# Patient Record
Sex: Female | Born: 1950 | Race: White | Hispanic: No | Marital: Married | Smoking: Never smoker
Health system: Southern US, Community
[De-identification: ages and names within clinical notes are randomized; demographics above are authoritative.]

## PROBLEM LIST (undated history)

## (undated) DIAGNOSIS — K219 Gastro-esophageal reflux disease without esophagitis: Secondary | ICD-10-CM

## (undated) DIAGNOSIS — E119 Type 2 diabetes mellitus without complications: Secondary | ICD-10-CM

## (undated) DIAGNOSIS — I2699 Other pulmonary embolism without acute cor pulmonale: Secondary | ICD-10-CM

---
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
## 2015-05-24 ENCOUNTER — Encounter (HOSPITAL_COMMUNITY): Payer: Self-pay | Admitting: Internal Medicine

## 2015-05-24 ENCOUNTER — Inpatient Hospital Stay (HOSPITAL_COMMUNITY)
Admission: AD | Admit: 2015-05-24 | Discharge: 2015-05-29 | DRG: 175 | Disposition: A | Discharge status: Home / Self Care | Payer: BLUE CROSS/BLUE SHIELD | Source: Other Acute Inpatient Hospital | Attending: Internal Medicine | Admitting: Internal Medicine

## 2015-05-24 DIAGNOSIS — I2692 Saddle embolus of pulmonary artery without acute cor pulmonale: Secondary | ICD-10-CM | POA: Diagnosis present

## 2015-05-24 DIAGNOSIS — I82441 Acute embolism and thrombosis of right tibial vein: Secondary | ICD-10-CM | POA: Diagnosis present

## 2015-05-24 DIAGNOSIS — I824Z1 Acute embolism and thrombosis of unspecified deep veins of right distal lower extremity: Secondary | ICD-10-CM | POA: Diagnosis not present

## 2015-05-24 DIAGNOSIS — D696 Thrombocytopenia, unspecified: Secondary | ICD-10-CM | POA: Diagnosis not present

## 2015-05-24 DIAGNOSIS — E1165 Type 2 diabetes mellitus with hyperglycemia: Secondary | ICD-10-CM | POA: Diagnosis not present

## 2015-05-24 DIAGNOSIS — I2699 Other pulmonary embolism without acute cor pulmonale: Secondary | ICD-10-CM

## 2015-05-24 DIAGNOSIS — Z880 Allergy status to penicillin: Secondary | ICD-10-CM

## 2015-05-24 DIAGNOSIS — E1310 Other specified diabetes mellitus with ketoacidosis without coma: Secondary | ICD-10-CM | POA: Diagnosis present

## 2015-05-24 DIAGNOSIS — K219 Gastro-esophageal reflux disease without esophagitis: Secondary | ICD-10-CM | POA: Diagnosis present

## 2015-05-24 DIAGNOSIS — E876 Hypokalemia: Secondary | ICD-10-CM | POA: Diagnosis present

## 2015-05-24 DIAGNOSIS — J9859 Other diseases of mediastinum, not elsewhere classified: Secondary | ICD-10-CM

## 2015-05-24 DIAGNOSIS — IMO0002 Reserved for concepts with insufficient information to code with codable children: Secondary | ICD-10-CM | POA: Insufficient documentation

## 2015-05-24 DIAGNOSIS — D649 Anemia, unspecified: Secondary | ICD-10-CM | POA: Diagnosis present

## 2015-05-24 DIAGNOSIS — Z23 Encounter for immunization: Secondary | ICD-10-CM | POA: Diagnosis not present

## 2015-05-24 DIAGNOSIS — R222 Localized swelling, mass and lump, trunk: Secondary | ICD-10-CM | POA: Diagnosis not present

## 2015-05-24 DIAGNOSIS — R911 Solitary pulmonary nodule: Secondary | ICD-10-CM | POA: Diagnosis present

## 2015-05-24 DIAGNOSIS — E1110 Type 2 diabetes mellitus with ketoacidosis without coma: Secondary | ICD-10-CM

## 2015-05-24 HISTORY — DX: Other pulmonary embolism without acute cor pulmonale: I26.99

## 2015-05-24 HISTORY — DX: Type 2 diabetes mellitus without complications: E11.9

## 2015-05-24 HISTORY — DX: Gastro-esophageal reflux disease without esophagitis: K21.9

## 2015-05-24 LAB — CBC
HEMATOCRIT: 36 % (ref 36.0–46.0)
HEMOGLOBIN: 13 g/dL (ref 12.0–15.0)
MCH: 30.7 pg (ref 26.0–34.0)
MCHC: 36.1 g/dL — ABNORMAL HIGH (ref 30.0–36.0)
MCV: 84.9 fL (ref 78.0–100.0)
Platelets: 122 10*3/uL — ABNORMAL LOW (ref 150–400)
RBC: 4.24 MIL/uL (ref 3.87–5.11)
RDW: 14.7 % (ref 11.5–15.5)
WBC: 10.7 10*3/uL — ABNORMAL HIGH (ref 4.0–10.5)

## 2015-05-24 LAB — COMPREHENSIVE METABOLIC PANEL
ALK PHOS: 91 U/L (ref 38–126)
ALT: 14 U/L (ref 14–54)
ANION GAP: 7 (ref 5–15)
AST: 19 U/L (ref 15–41)
Albumin: 2.7 g/dL — ABNORMAL LOW (ref 3.5–5.0)
BUN: 7 mg/dL (ref 6–20)
CHLORIDE: 108 mmol/L (ref 101–111)
CO2: 22 mmol/L (ref 22–32)
Calcium: 8.3 mg/dL — ABNORMAL LOW (ref 8.9–10.3)
Creatinine, Ser: 0.94 mg/dL (ref 0.44–1.00)
GFR calc Af Amer: >60 mL/min (ref >60)
GFR calc non Af Amer: >60 mL/min (ref >60)
GLUCOSE: 189 mg/dL — AB (ref 65–99)
Potassium: 2.7 mmol/L — CL (ref 3.5–5.1)
SODIUM: 137 mmol/L (ref 135–145)
TOTAL PROTEIN: 5.1 g/dL — AB (ref 6.5–8.1)
Total Bilirubin: 0.5 mg/dL (ref 0.3–1.2)

## 2015-05-24 LAB — MAGNESIUM: Magnesium: 1.8 mg/dL (ref 1.7–2.4)

## 2015-05-24 LAB — GLUCOSE, CAPILLARY: GLUCOSE-CAPILLARY: 188 mg/dL — AB (ref 65–99)

## 2015-05-24 LAB — URINALYSIS, ROUTINE W REFLEX MICROSCOPIC
Bilirubin Urine: NEGATIVE
Ketones, ur: NEGATIVE mg/dL
Leukocytes, UA: NEGATIVE
NITRITE: NEGATIVE
Protein, ur: NEGATIVE mg/dL
Specific Gravity, Urine: 1.042 — ABNORMAL HIGH (ref 1.005–1.030)
Urobilinogen, UA: 1 mg/dL (ref 0.0–1.0)
pH: 6 (ref 5.0–8.0)

## 2015-05-24 LAB — PHOSPHORUS: PHOSPHORUS: 1.2 mg/dL — AB (ref 2.5–4.6)

## 2015-05-24 LAB — PROTIME-INR
INR: 1.3 (ref 0.00–1.49)
Prothrombin Time: 16.3 seconds — ABNORMAL HIGH (ref 11.6–15.2)

## 2015-05-24 LAB — HEPARIN LEVEL (UNFRACTIONATED): Heparin Unfractionated: 1.14 IU/mL — ABNORMAL HIGH (ref 0.30–0.70)

## 2015-05-24 LAB — APTT

## 2015-05-24 LAB — URINE MICROSCOPIC-ADD ON

## 2015-05-24 LAB — MRSA PCR SCREENING: MRSA BY PCR: POSITIVE — AB

## 2015-05-24 MED ORDER — SODIUM CHLORIDE 0.9 % IV SOLN: 250.0000 mL | INTRAVENOUS | Status: DC | PRN

## 2015-05-24 MED ORDER — PANTOPRAZOLE SODIUM 40 MG PO TBEC
40.0000 mg | DELAYED_RELEASE_TABLET | Freq: Every day | ORAL | Status: DC
  Administered 2015-05-25 – 2015-05-29 (×5): 40 mg via ORAL
  Filled 2015-05-24 (×5): qty 1

## 2015-05-24 MED ORDER — INSULIN ASPART 100 UNIT/ML ~~LOC~~ SOLN
2.0000 [IU] | SUBCUTANEOUS | Status: DC
  Administered 2015-05-25 (×2): 2 [IU] via SUBCUTANEOUS

## 2015-05-24 MED ORDER — MUPIROCIN 2 % EX OINT
1.0000 "application " | TOPICAL_OINTMENT | Freq: Two times a day (BID) | CUTANEOUS | Status: AC
  Administered 2015-05-25 – 2015-05-29 (×10): 1 via NASAL
  Filled 2015-05-24 (×6): qty 22

## 2015-05-24 MED ORDER — CHLORHEXIDINE GLUCONATE CLOTH 2 % EX PADS
6.0000 | MEDICATED_PAD | Freq: Every day | CUTANEOUS | Status: DC
  Administered 2015-05-25 – 2015-05-28 (×4): 6 via TOPICAL

## 2015-05-24 MED ORDER — HEPARIN (PORCINE) IN NACL 100-0.45 UNIT/ML-% IJ SOLN
1150.0000 [IU]/h | INTRAMUSCULAR | Status: DC
Start: 2015-05-24 — End: 2015-05-25
  Administered 2015-05-25: 1150 [IU]/h via INTRAVENOUS

## 2015-05-24 MED ORDER — POTASSIUM CHLORIDE CRYS ER 20 MEQ PO TBCR
40.0000 meq | EXTENDED_RELEASE_TABLET | Freq: Once | ORAL | Status: AC
  Administered 2015-05-24: 40 meq via ORAL
  Filled 2015-05-24: qty 2

## 2015-05-24 MED ORDER — HEPARIN (PORCINE) IN NACL 100-0.45 UNIT/ML-% IJ SOLN
1360.0000 [IU]/h | INTRAMUSCULAR | Status: DC
  Administered 2015-05-24: 1350 [IU]/h via INTRAVENOUS

## 2015-05-24 NOTE — H&P (Signed)
PULMONARY / CRITICAL CARE MEDICINE HISTORY AND PHYSICAL EXAMINATION   Name: Kristen Contreras MRN: [id number] DOB: 06/23/1951    ADMISSION DATE:  05/24/2015  PRIMARY SERVICE: PCCM  CHIEF COMPLAINT:  PE  BRIEF PATIENT DESCRIPTION: 26 F with DM admitted to [facility] on 6/1 for DKA who developed vasovagal symptoms on 6/3 during BM which lead to PE protocol CT who revealed a large PE. Patient transferred to [facility] for further evaluation.  SIGNIFICANT EVENTS / STUDIES:  PE Protocol CT [facility]: Large Saddle PE with RV/LV ratio of 2.2 and 7 mm LLL and mediastinal mass, Pericholecystic Fluid  LINES / TUBES: PIVs  CULTURES: Blood cultures at [place]: None  HISTORY OF PRESENT ILLNESS:  Ms. Kristen Contreras is a 63 F with no significant previous PMH who was admitted to [facility] on 6/1 with DKA which was detected after she presented for 1 week of not feeling well. Her sugars were stabilized and she was transferred to the floor for further management. This morning, she had one of several recent episodes of presyncope which resulting in a CTA being done which detected a large PE. She was transferred for consideration of advanced therapy.   She has minimal current complaints. She feels "puffy" and believes she has gotten 20 L fluid recently. She denies chest pain, fevers, chills, cough, and abdominal pain. She notes that she has not had any preventative medical care since she had her C-section approximately 26 yrs ago. She takes a PPI for self-diagnosed GERD.   PAST MEDICAL HISTORY :  Past Medical History  Diagnosis Date  . DM (diabetes mellitus)   . Pulmonary embolism    Past Surgical History  Procedure Laterality Date  . Cesarean section     Prior to Admission medications   Not on File   Allergies  Allergen Reactions  . Penicillins     FAMILY HISTORY:  Family History  Problem Relation Age of Onset  . Heart attack Father 49  . Heart attack Mother 80   SOCIAL HISTORY:  reports that she  has never smoked. She does not have any smokeless tobacco history on file. She reports that she does not drink alcohol. Her drug history is not on file.  REVIEW OF SYSTEMS:  A 12-system ROS was conducted and, unless otherwise specified in the HPI, was negative.   SUBJECTIVE:   VITAL SIGNS: Temp:  [97.9 F (36.6 C)] 97.9 F (36.6 C) (06/04 1952) Pulse Rate:  [114] 114 (06/04 1908) Resp:  [19] 19 (06/04 1908) BP: (113)/(78) 113/78 mmHg (06/04 1908) SpO2:  [99 %] 99 % (06/04 1908) HEMODYNAMICS:   VENTILATOR SETTINGS:   INTAKE / OUTPUT: Intake/Output    None     PHYSICAL EXAMINATION: General:  NAD Neuro:  Intact HEENT:  Sclera anicteric, conjunctiva pink, MMM, OP Cleaer Neck: Trachea supple and midline, (-) LAN or JVD Cardiovascular:  RRR, nS1/S2, (-) MRG Lungs:  CTAB Abdomen:  S/NT/ND/(+)BS, no Murhpy's sign Musculoskeletal:  (-) C/C/E, compression stocking present Skin:  Intact  LABS:  Reviewed [facility] Paper Chart, Relevant Labs from today Summarized Below: UA with (+) Glucose PLT 136 K 3.3 Mg 1.6 Bicarb 12 AG 20 Trop 0.01 ALK 118 BNP 118 ABG 7.35/20/62  CBC No results for input(s): WBC, HGB, HCT, PLT in the last 168 hours. Coag's No results for input(s): APTT, INR in the last 168 hours. BMET No results for input(s): NA, K, CL, CO2, BUN, CREATININE, GLUCOSE in the last 168 hours. Electrolytes No results for input(s):  CALCIUM, MG, PHOS in the last 168 hours. Sepsis Markers No results for input(s): LATICACIDVEN, PROCALCITON, O2SATVEN in the last 168 hours. ABG No results for input(s): PHART, PCO2ART, PO2ART in the last 168 hours. Liver Enzymes No results for input(s): AST, ALT, ALKPHOS, BILITOT, ALBUMIN in the last 168 hours. Cardiac Enzymes No results for input(s): TROPONINI, PROBNP in the last 168 hours. Glucose No results for input(s): GLUCAP in the last 168 hours.  Imaging No results found.  EKG: None included in transfer packet CT PE:  Personally reviewed, results summarized above (agree with OSH read)  ASSESSMENT / PLAN:  Active Problems:   * No active hospital problems. *   PULMONARY A: Pulmonary nodule and mediastinal mass: Can address once plan for PE Rx established. ? Residual thymus tissue for mediastinal mass. Pulmonary nodule low risk given absent smoking histroy. P:   Address mediastinal mass when less acute Repeat CT scan in 6-12 months and then if no change in 18-24 months  CARDIOVASCULAR A: No acute issue:   RENAL A: Hypokalemia:  Hyopmagensiumemia: P:   Replete pending repeated labs here  GASTROINTESTINAL A: Pericholecystic Fluid with elavated ALK phos: No evidence of cholecystitis on exam.  P:   Monitor  HEMATOLOGIC A: Acute PE with evidence of right heart strain: No emergent indication for intervention as HD stable. Presence of mediastinal mass concerning given VTE.   P:   VIR evaluation in AM  Heparin GTT, can potentially start oral anticoags once decision RE EKOS made in AM LE dopplers Check Echo   INFECTIOUS A: No acute issues:  P:     ENDOCRINE A: DKA with possible reopening of GAP: Based on labs from [facility] gap has likely reopened. No need for aggressive fluids again if gap open but will need insulin drip. P:   Follow-up labs (discussed with Dr. Stevenson Clinch in [place]), DM pending results NPO for now given possible need for drip  NEUROLOGIC A: No acute issues:  P:     BEST PRACTICE / DISPOSITION Level of Care:  ICU Primary Service:  PCCM Consultants:  VIR Code Status:  Full Diet:  NPO for now DVT Px:  On Full Dose AC GI Px:  On home PPI Skin Integrity:  Intact Social / Family:  Patient able to update  TODAY'S SUMMARY:   I have personally obtained a history, examined the patient, evaluated laboratory and imaging results, formulated the assessment and plan and placed orders.  CRITICAL CARE: The patient is critically ill with multiple organ systems failure and requires  high complexity decision making for assessment and support, frequent evaluation and titration of therapies, application of advanced monitoring technologies and extensive interpretation of multiple databases. Critical Care Time devoted to patient care services described in this note is 35 minutes.   Margarette Asal, MD Pulmonary and [place] Pager: [phone]   05/24/2015, 8:03 PM

## 2015-05-24 NOTE — Progress Notes (Signed)
CRITICAL VALUE ALERT  Critical value received:  APTT >200  Date of notification:  05/24/15  Time of notification:  2227  Critical value read back: Yes  Nurse who received alert:  Enzo BiBryce Sheppard RN MD notified (1st page):  MD Mungal   Time of first page:  2235  MD notified (2nd page):  Time of second page:  Responding MD:  MD Dema SeverinMungal   Time MD responded:  2240

## 2015-05-24 NOTE — Progress Notes (Signed)
CRITICAL VALUE ALERT  Critical value received:  Potassium 2.7  Date of notification: 05/24/15  Time of notification: 2202   Critical value read back: Yes  Nurse who received alert: Hazel Samshristian Contina Strain RN   MD notified (1st page): MD Mungal   Time of first page: 2205  MD notified (2nd page):  Time of second page:  Responding MD: MD Dema SeverinMungal   Time MD responded:  2205

## 2015-05-24 NOTE — Progress Notes (Signed)
Stopped Heparin IV drip and notified Christoper FabianCaron Amend [facility] in [facility] to patient's Heparin level and aPTT being elevated. Christoper FabianCaron Amend [facility] ordered to hold heparin and will restart in an hour with new dosing order. Pt is not showing any signs of bleeding at this time. Pt is alert and oriented. Will continue to monitor and assess.

## 2015-05-24 NOTE — Progress Notes (Signed)
ANTICOAGULATION CONSULT NOTE - Follow-up Consult  Pharmacy Consult for heparin Indication: pulmonary embolism  Allergies  Allergen Reactions  . Penicillins     Patient Measurements:   Heparin Dosing Weight: 72.7 kg  Vital Signs: Temp: 97.9 F (36.6 C) (06/04 1952) Temp Source: Oral (06/04 1952) BP: 113/80 mmHg (06/04 2200) Pulse Rate: 102 (06/04 2200)  Labs:  Recent Labs  05/24/15 2030  HGB 13.0  HCT 36.0  PLT 122*  APTT >200*  LABPROT 16.3*  INR 1.30  HEPARINUNFRC 1.14*  CREATININE 0.94    CrCl cannot be calculated (Unknown ideal weight.).  Assessment: 64 yo female on heparin for pulmonary embolism. Heparin level remains elevated at 1.14 on 1360 units/hr. [facility]. CBC stable, plt 122. No bleeding noted.    Goal of Therapy:  Heparin level 0.3-0.7 units/ml Monitor platelets by anticoagulation protocol: Yes   Plan:  - Hold heparin x 1 hour - Restart heparin at 1150 units/hr - F/u 6 hr heparin level - Daily heparin level and CBC  Christoper Fabianaron Rilya Longo, PharmD, BCPS Clinical pharmacist, pager [phone] 05/24/2015,10:37 PM

## 2015-05-24 NOTE — Progress Notes (Signed)
ANTICOAGULATION CONSULT NOTE - Initial Consult  Pharmacy Consult for heparin Indication: pulmonary embolism  Allergies  Allergen Reactions  . Penicillins     Patient Measurements:   Heparin Dosing Weight: 72.7 kg  Vital Signs: Temp: 97.9 F (36.6 C) (06/04 1952) Temp Source: Oral (06/04 1952) BP: 113/78 mmHg (06/04 1908) Pulse Rate: 114 (06/04 1908)  Labs: No results for input(s): HGB, HCT, PLT, APTT, LABPROT, INR, HEPARINUNFRC, CREATININE, CKTOTAL, CKMB, TROPONINI in the last 72 hours.  CrCl cannot be calculated (Unknown ideal weight.).   Medical History: Past Medical History  Diagnosis Date  . DM (diabetes mellitus)   . Pulmonary embolism   . GERD (gastroesophageal reflux disease)     Medications:  Scheduled:  . pantoprazole  40 mg Oral Daily   Infusions:    Assessment: 64 yo female [facility]  [facility]  Per RN, heparin level @ [facility] this morning was 1.03 and drip rate was adjusted to 1360 units/hr, which was the rate the patient is currently on.  Hgb 13 and Plt 136 K @ [facility] this morning.    Patient weighs 82.9 kg and is 5 feet 4 inches.  Goal of Therapy:  Heparin level 0.3-0.7 units/ml Monitor platelets by anticoagulation protocol: Yes   Plan:  - continue heparin @ 1360 units/hr for now (which is the [facility]) - stat heparin level to reassess dosing - daily heparin level and CBC  Antolin Belsito, Tsz-Yin 05/24/2015,8:10 PM

## 2015-05-25 ENCOUNTER — Inpatient Hospital Stay (HOSPITAL_COMMUNITY): Payer: BLUE CROSS/BLUE SHIELD

## 2015-05-25 DIAGNOSIS — R222 Localized swelling, mass and lump, trunk: Secondary | ICD-10-CM

## 2015-05-25 DIAGNOSIS — E1310 Other specified diabetes mellitus with ketoacidosis without coma: Secondary | ICD-10-CM

## 2015-05-25 DIAGNOSIS — I2699 Other pulmonary embolism without acute cor pulmonale: Secondary | ICD-10-CM

## 2015-05-25 DIAGNOSIS — J9859 Other diseases of mediastinum, not elsewhere classified: Secondary | ICD-10-CM

## 2015-05-25 DIAGNOSIS — E1110 Type 2 diabetes mellitus with ketoacidosis without coma: Secondary | ICD-10-CM

## 2015-05-25 LAB — BASIC METABOLIC PANEL
ANION GAP: 8 (ref 5–15)
Anion gap: 9 (ref 5–15)
BUN: 7 mg/dL (ref 6–20)
BUN: 7 mg/dL (ref 6–20)
CALCIUM: 8.1 mg/dL — AB (ref 8.9–10.3)
CHLORIDE: 111 mmol/L (ref 101–111)
CHLORIDE: 110 mmol/L (ref 101–111)
CO2: 20 mmol/L — ABNORMAL LOW (ref 22–32)
CO2: 22 mmol/L (ref 22–32)
CREATININE: 0.76 mg/dL (ref 0.44–1.00)
CREATININE: 0.79 mg/dL (ref 0.44–1.00)
Calcium: 8.6 mg/dL — ABNORMAL LOW (ref 8.9–10.3)
GFR calc Af Amer: >60 mL/min (ref >60)
GFR calc Af Amer: >60 mL/min (ref >60)
GFR calc non Af Amer: >60 mL/min (ref >60)
GFR calc non Af Amer: >60 mL/min (ref >60)
GLUCOSE: 214 mg/dL — AB (ref 65–99)
Glucose, Bld: 100 mg/dL — ABNORMAL HIGH (ref 65–99)
POTASSIUM: 2.9 mmol/L — AB (ref 3.5–5.1)
POTASSIUM: 4.6 mmol/L (ref 3.5–5.1)
SODIUM: 140 mmol/L (ref 135–145)
SODIUM: 140 mmol/L (ref 135–145)

## 2015-05-25 LAB — CBC
HEMATOCRIT: 33.4 % — AB (ref 36.0–46.0)
HEMOGLOBIN: 12.2 g/dL (ref 12.0–15.0)
MCH: 31 pg (ref 26.0–34.0)
MCHC: 36.5 g/dL — ABNORMAL HIGH (ref 30.0–36.0)
MCV: 85 fL (ref 78.0–100.0)
Platelets: 102 10*3/uL — ABNORMAL LOW (ref 150–400)
RBC: 3.93 MIL/uL (ref 3.87–5.11)
RDW: 14.6 % (ref 11.5–15.5)
WBC: 9.6 10*3/uL (ref 4.0–10.5)

## 2015-05-25 LAB — TROPONIN I: Troponin I: 0.07 ng/mL — ABNORMAL HIGH (ref <0.031)

## 2015-05-25 LAB — HEPARIN LEVEL (UNFRACTIONATED)
Heparin Unfractionated: 1.05 IU/mL — ABNORMAL HIGH (ref 0.30–0.70)
Heparin Unfractionated: 0.76 IU/mL — ABNORMAL HIGH (ref 0.30–0.70)

## 2015-05-25 LAB — GLUCOSE, CAPILLARY
GLUCOSE-CAPILLARY: 93 mg/dL (ref 65–99)
GLUCOSE-CAPILLARY: 189 mg/dL — AB (ref 65–99)
Glucose-Capillary: 131 mg/dL — ABNORMAL HIGH (ref 65–99)
Glucose-Capillary: 147 mg/dL — ABNORMAL HIGH (ref 65–99)
Glucose-Capillary: 190 mg/dL — ABNORMAL HIGH (ref 65–99)
Glucose-Capillary: 230 mg/dL — ABNORMAL HIGH (ref 65–99)
Glucose-Capillary: 243 mg/dL — ABNORMAL HIGH (ref 65–99)

## 2015-05-25 LAB — PHOSPHORUS: Phosphorus: 2.6 mg/dL (ref 2.5–4.6)

## 2015-05-25 LAB — LACTIC ACID, PLASMA
LACTIC ACID, VENOUS: 3.7 mmol/L — AB (ref 0.5–2.0)
Lactic Acid, Venous: 2.9 mmol/L (ref 0.5–2.0)

## 2015-05-25 LAB — PROCALCITONIN

## 2015-05-25 LAB — MAGNESIUM: Magnesium: 2.3 mg/dL (ref 1.7–2.4)

## 2015-05-25 MED ORDER — SODIUM CHLORIDE 0.9 % IV BOLUS (SEPSIS)
1500.0000 mL | Freq: Once | INTRAVENOUS | Status: AC
  Administered 2015-05-25: 1500 mL via INTRAVENOUS

## 2015-05-25 MED ORDER — PNEUMOCOCCAL VAC POLYVALENT 25 MCG/0.5ML IJ INJ
0.5000 mL | INJECTION | INTRAMUSCULAR | Status: AC
  Administered 2015-05-27: 0.5 mL via INTRAMUSCULAR
  Filled 2015-05-25: qty 0.5

## 2015-05-25 MED ORDER — HEPARIN (PORCINE) IN NACL 100-0.45 UNIT/ML-% IJ SOLN
1250.0000 [IU]/h | INTRAMUSCULAR | Status: AC
  Administered 2015-05-25: 850 [IU]/h via INTRAVENOUS
  Administered 2015-05-25: 950 [IU]/h via INTRAVENOUS
  Administered 2015-05-26: 900 [IU]/h via INTRAVENOUS
  Administered 2015-05-28: 1000 [IU]/h via INTRAVENOUS
  Filled 2015-05-25 (×5): qty 250

## 2015-05-25 MED ORDER — DEXTROSE-NACL 5-0.45 % IV SOLN
INTRAVENOUS | Status: DC
  Administered 2015-05-25: 12:00:00 via INTRAVENOUS

## 2015-05-25 MED ORDER — INSULIN GLARGINE 100 UNIT/ML ~~LOC~~ SOLN
5.0000 [IU] | Freq: Every day | SUBCUTANEOUS | Status: DC
  Administered 2015-05-25 – 2015-05-28 (×4): 5 [IU] via SUBCUTANEOUS
  Filled 2015-05-25 (×4): qty 0.05

## 2015-05-25 MED ORDER — INSULIN ASPART 100 UNIT/ML ~~LOC~~ SOLN
0.0000 [IU] | SUBCUTANEOUS | Status: DC
  Administered 2015-05-25: 5 [IU] via SUBCUTANEOUS
  Administered 2015-05-25 – 2015-05-26 (×4): 3 [IU] via SUBCUTANEOUS
  Administered 2015-05-26: 2 [IU] via SUBCUTANEOUS

## 2015-05-25 MED ORDER — POTASSIUM CHLORIDE 20 MEQ PO PACK
40.0000 meq | PACK | ORAL | Status: DC
Start: 2015-05-25 — End: 2015-05-25

## 2015-05-25 MED ORDER — POTASSIUM PHOSPHATES 15 MMOLE/5ML IV SOLN
24.0000 mmol | Freq: Once | INTRAVENOUS | Status: AC
  Administered 2015-05-25: 24 mmol via INTRAVENOUS
  Filled 2015-05-25: qty 8

## 2015-05-25 MED ORDER — POTASSIUM CHLORIDE CRYS ER 20 MEQ PO TBCR
40.0000 meq | EXTENDED_RELEASE_TABLET | ORAL | Status: AC
  Administered 2015-05-25 (×2): 40 meq via ORAL
  Filled 2015-05-25 (×2): qty 2

## 2015-05-25 MED ORDER — MAGNESIUM SULFATE 2 GM/50ML IV SOLN
2.0000 g | Freq: Once | INTRAVENOUS | Status: AC
  Administered 2015-05-25: 2 g via INTRAVENOUS
  Filled 2015-05-25: qty 50

## 2015-05-25 NOTE — Progress Notes (Addendum)
LAb review pm below  PULMONARY No results for input(s): PHART, PCO2ART, PO2ART, HCO3, TCO2, O2SAT in the last 168 hours.  Invalid input(s): PCO2, PO2  CBC  Recent Labs Lab 05/24/15 2030 05/25/15 0541  HGB 13.0 12.2  HCT 36.0 33.4*  WBC 10.7* 9.6  PLT 122* 102*    COAGULATION  Recent Labs Lab 05/24/15 2030  INR 1.30    CARDIAC   Recent Labs Lab 05/25/15 1315  TROPONINI 0.07*   No results for input(s): PROBNP in the last 168 hours.   CHEMISTRY  Recent Labs Lab 05/24/15 2030 05/25/15 0541 05/25/15 1315  NA 137 140 140  K 2.7* 2.9* 4.6  CL 108 111 110  CO2 22 20* 22  GLUCOSE 189* 100* 214*  BUN 7 7 7   CREATININE 0.94 0.76 0.79  CALCIUM 8.3* 8.1* 8.6*  MG 1.8  --  2.3  PHOS 1.2*  --  2.6   CrCl cannot be calculated (Unknown ideal weight.).   LIVER  Recent Labs Lab 05/24/15 2030  AST 19  ALT 14  ALKPHOS 91  BILITOT 0.5  PROT 5.1*  ALBUMIN 2.7*  INR 1.30     INFECTIOUS  Recent Labs Lab 05/25/15 1315  LATICACIDVEN 3.7*  PROCALCITON <0.10     ENDOCRINE CBG (last 3)   Recent Labs  05/25/15 0400 05/25/15 0819 05/25/15 1231  GLUCAP 131* 93 189*         IMAGING x48h  - image(s) personally visualized  -   highlighted in bold Dg Chest Port 1 View  05/25/2015   CLINICAL DATA:  Pulmonary embolism.  EXAM: PORTABLE CHEST - 1 VIEW  COMPARISON:  None.  FINDINGS: Normal mediastinum and cardiac silhouette. Normal pulmonary vasculature. No evidence of effusion, infiltrate, or pneumothorax. No acute bony abnormality.  IMPRESSION: No acute cardiopulmonary process.   Electronically Signed   By: Genevive BiStewart  Edmunds M.D.   On: 05/25/2015 15:12     A/P  ECHO - RV STRAIN - SEVERE - call from Dr Anne FuSkains 05/25/2015 5:01 PM Hhigh lactic acidosis - >? Due to PE likely- ? Actually better than yesterday; we dont know. PCT normal - argues against sepsis   - plan: 1.5L saline bolus and recheck lactate at 7pm,   - If worse, definitely needs lytics as  rescue    DKA - bic improved to 22, Aniong gap 8 with alb 2.7 - corrected for normal alb - still normal  - continue D5 half normal at 50cc/h  - continue lantus 5 daily + ssi  Hypomag/Hypophos -resolved  Tx  - ok for sdu -trh pick up 05/26/15  But CCM will stay on board due to lactic acidosis  + RV strain  Dr. Kalman ShanMurali Special Ranes, M.D., [facility].C.P Pulmonary and Critical Care Medicine Staff Physician [facility]monary and Critical Care Pager: [phone], If no answer or between  15:00h - 7:00h: call [phone]  05/25/2015 3:19 PM

## 2015-05-25 NOTE — Progress Notes (Addendum)
PULMONARY / CRITICAL CARE MEDICINE HISTORY AND PHYSICAL EXAMINATION   Name: Kristen Contreras MRN: [id number] DOB: 09/21/1951    ADMISSION DATE:  05/24/2015  PRIMARY SERVICE: PCCM  CHIEF COMPLAINT:  PE  BRIEF PATIENT DESCRIPTION:  [name] is a 66 F with no significant previous PMH who was admitted to [facility] on 6/1 with DKA which was detected after she presented for 1 week of not feeling well. Her sugars were stabilized and she was transferred to the floor for further management. This morning, she had one of several recent episodes of presyncope which resulting in a CTA being done which detected a large PE. She was transferred for consideration of advanced therapy.   She has minimal current complaints. She feels "puffy" and believes she has gotten 20 L fluid recently. She denies chest pain, fevers, chills, cough, and abdominal pain. She notes that she has not had any preventative medical care since she had her C-section approximately 26 yrs ago. She takes a PPI for self-diagnosed GERD.     reports that she has never smoked. She does not have any smokeless tobacco history on file.   has a past medical history of DM (diabetes mellitus); Pulmonary embolism; and GERD (gastroesophageal reflux disease).   LINES / TUBES: PIVs  CULTURES: Blood cultures at [facility]  ANTIBIOTICS: None    SIGNIFICANT EVENTS / STUDIES:  PE Protocol CT [facility]: Large Saddle PE with RV/LV ratio of 2.2 and 7 mm LLL and mediastinal mass, Pericholecystic Fluid    SUBJECTIVE:  05/25/2015: Sittting in bed and texting on phone on RA and talking to husband at bedside. At time of transfer with O2  - Class2 Low PESI score. Since then weaned off o2 - now PESI Class1.  Per RN: no overnight issues. On IV heparin  - pharmacy says she is supra=therapeutic. Awatiing ECHO and duplex LE  Per RN: she is not in DKA now (Was prior to transfer). She is on subcut insulin and ssi ion phase 1 hypergluycemia. This is new onset DKA.  But bic this am dropped to 20 (was 24). Currntly  Not on fluids    VITAL SIGNS: Temp:  [97.8 F (36.6 C)-98.3 F (36.8 C)] 97.8 F (36.6 C) (06/05 0817) Pulse Rate:  [86-114] 96 (06/05 0800) Resp:  [14-23] 20 (06/05 0800) BP: (92-122)/(62-82) 122/70 mmHg (06/05 0800) SpO2:  [91 %-99 %] 95 % (06/05 0800) Weight:  [83 kg (182 lb 15.7 oz)-83.4 kg (183 lb 13.8 oz)] 83.4 kg (183 lb 13.8 oz) (06/05 0500) HEMODYNAMICS:   VENTILATOR SETTINGS:   INTAKE / OUTPUT: Intake/Output      06/04 0701 - 06/05 0700 06/05 0701 - 06/06 0700   P.O. 240    I.V. (mL/kg) 93.5 (1.1) 2.5 (0)   Total Intake(mL/kg) 333.5 (4) 2.5 (0)   Urine (mL/kg/hr) 500    Total Output 500     Net -166.6 +2.5          PHYSICAL EXAMINATION: General:  NAD. Looks well Neuro:  Intact HEENT:  Sclera anicteric, conjunctiva pink, MMM, OP Cleaer Neck: Trachea supple and midline, (-) LAN or JVD Cardiovascular:  RRR, nS1/S2, (-) MRG Lungs:  CTAB Abdomen:  S/NT/ND/(+)BS, no Murhpy's sign Musculoskeletal:  (-) C/C/E, compression stocking present Skin:  Intact  LABS:   [facility] Paper Chart, Relevant Labs from today Summarized Below: UA with (+) Glucose PLT 136 K 3.3 Mg 1.6 Bicarb 12 AG 20 Trop 0.01 ALK 118 BNP 118 ABG 7.35/20/62  PULMONARY No results for  input(s): PHART, PCO2ART, PO2ART, HCO3, TCO2, O2SAT in the last 168 hours.  Invalid input(s): PCO2, PO2  CBC  Recent Labs Lab 05/24/15 2030 05/25/15 0541  HGB 13.0 12.2  HCT 36.0 33.4*  WBC 10.7* 9.6  PLT 122* 102*    COAGULATION  Recent Labs Lab 05/24/15 2030  INR 1.30    CARDIAC  No results for input(s): TROPONINI in the last 168 hours. No results for input(s): PROBNP in the last 168 hours.   CHEMISTRY  Recent Labs Lab 05/24/15 2030 05/25/15 0541  NA 137 140  K 2.7* 2.9*  CL 108 111  CO2 22 20*  GLUCOSE 189* 100*  BUN 7 7  CREATININE 0.94 0.76  CALCIUM 8.3* 8.1*  MG 1.8  --   PHOS 1.2*  --    CrCl cannot be  calculated (Unknown ideal weight.).   LIVER  Recent Labs Lab 05/24/15 2030  AST 19  ALT 14  ALKPHOS 91  BILITOT 0.5  PROT 5.1*  ALBUMIN 2.7*  INR 1.30     INFECTIOUS No results for input(s): LATICACIDVEN, PROCALCITON in the last 168 hours.   ENDOCRINE CBG (last 3)   Recent Labs  05/24/15 2355 05/25/15 0400 05/25/15 0819  GLUCAP 147* 131* 93         IMAGING x48h  No results found.  EKG: None included in transfer packet   ASSESSMENT / PLAN:  Active Problems:   Pulmonary embolism   PULMONARY A: Pulmonary nodule and mediastinal mass: Can address once plan for PE Rx established. ? Residual thymus tissue for mediastinal mass. Pulmonary nodule low risk for malignancy given absent smoking histroy. P:   Address mediastinal mass when less acute Repeat CT scan in 6-12 months and then if no change in 18-24 months   CARDIOVASCULAR A: No acute issue:   RENAL A: Hypokalemia -severe Hypomagnesemia - mild < 2gm% Hypophosphatemia - severe  P:   Replete all 3 Dc foley   GASTROINTESTINAL A: Pericholecystic Fluid with elavated ALK phos: No evidence of cholecystitis on exam.   - LFT normal at admit P:   Monitor Diabeteic diet  HEMATOLOGIC A: Acute PE with evidence of right heart strain: No emergent indication for intervention as HD stable.    - Severity - PESI class 2 Low risk at admit, Currently improved to Class 1  - Idiopathich but mediastinal mass needs workup  05/25/15 : improved clinically . On IV heparin   P:   No need for EKOS -reserve as rescue Rx given PESI-2 Heparin GTT would do 3-5 days of IV heparin and then start oral agent  Need to discuss oral anticoagulation strategy 05/26/15 LE dopplers Check Echo  INFECTIOUS A: No acute issues:  P:   Check sepsis biomarketrs to be on safe side  ENDOCRINE A: DKA with possible reopening of GAP:    - BIC down to 20 this am 05/25/15 and worse  PLAN - repeat bmet  - start d5 half normal  50cc/h - start daily low dose lantus - ssi - DM consult - DM diet   NEUROLOGIC A: No acute issues:  P:     BEST PRACTICE / DISPOSITION Level of Care:  ICU -.move to floor/tele based on bmet results from noon 05/25/15 Primary Service:  PCCM -> will need to give to triad Consultants:  VIR Code Status:  Full Diet:  DM diet DVT Px:  On Full Dose AC GI Px:  On home PPI Skin Integrity:  Intact Social / Family:  Patient able to update. Husband updated     Dr. Brand Males, M.D., [facility].C.P Pulmonary and Critical Care Medicine Staff Physician [place] [facility]monary and Critical Care Pager: [phone], If no answer or between  15:00h - 7:00h: call [phone]  05/25/2015 11:41 AM

## 2015-05-25 NOTE — Progress Notes (Signed)
Lactic Acid of 2.9 called to AvnetPaul RN to black box.  Victorino DikeLeslie Farra Nikolic RN

## 2015-05-25 NOTE — Progress Notes (Signed)
eLink Physician-Brief Progress Note Patient Name: Kristen KailDebra Contreras DOB: 09/27/1951 MRN: [id number]   Date of Service  05/25/2015  HPI/Events of Note    eICU Interventions  Potassium given     Intervention Category Intermediate Interventions: Electrolyte abnormality - evaluation and management  [name] S. 05/25/2015, 6:49 AM

## 2015-05-25 NOTE — Progress Notes (Signed)
VASCULAR LAB PRELIMINARY  PRELIMINARY  PRELIMINARY  PRELIMINARY  Bilateral lower extremity venous Dopplers completed.    Preliminary report:  There is acute DVT noted in the right posterior tibial vein.  All other veins appear thrombus free.  Kaisy Severino, RVT 05/25/2015, 1:08 PM

## 2015-05-25 NOTE — Progress Notes (Signed)
CRITICAL VALUE ALERT  Critical value received:  Lactic acid: 3.7                                        Troponin 0.07   Nurse who received alert:  Aurelio JewMelinda M., RN  MD notified.

## 2015-05-25 NOTE — Progress Notes (Signed)
ANTICOAGULATION CONSULT NOTE - Follow Up Consult  Pharmacy Consult for heparin Indication: pulmonary embolus  Allergies  Allergen Reactions  . Penicillins Itching and Rash    Patient Measurements: Weight: 183 lb 13.8 oz (83.4 kg) Heparin Dosing Weight: 72.7 kg  Vital Signs: Temp: 97.9 F (36.6 C) (06/05 1235) Temp Source: Oral (06/05 1235) BP: 103/58 mmHg (06/05 1500) Pulse Rate: 105 (06/05 1500)  Labs:  Recent Labs  05/24/15 2030 05/25/15 0541 05/25/15 1315 05/25/15 1612  HGB 13.0 12.2  --   --   HCT 36.0 33.4*  --   --   PLT 122* 102*  --   --   APTT >200*  --   --   --   LABPROT 16.3*  --   --   --   INR 1.30  --   --   --   HEPARINUNFRC 1.14* 1.05*  --  0.76*  CREATININE 0.94 0.76 0.79  --   TROPONINI  --   --  0.07*  --     CrCl cannot be calculated (Unknown ideal weight.).   Medications:  Scheduled:  . Chlorhexidine Gluconate Cloth  6 each Topical Q0600  . insulin aspart  0-15 Units Subcutaneous 6 times per day  . insulin glargine  5 Units Subcutaneous Daily  . mupirocin ointment  1 application Nasal BID  . pantoprazole  40 mg Oral Daily  . [START ON 05/26/2015] pneumococcal 23 valent vaccine  0.5 mL Intramuscular Tomorrow-1000  . potassium phosphate IVPB (mmol)  24 mmol Intravenous Once  . sodium chloride  1,500 mL Intravenous Once   Infusions:  . dextrose 5 % and 0.45% NaCl 50 mL/hr at 05/25/15 1219  . heparin 950 Units/hr (05/25/15 0900)    Assessment: 64 yo female with PE is currently on supratherapeutic heparin.  Heparin level is 0.76.   Goal of Therapy:  Heparin level 0.3-0.7 Monitor platelets by anticoagulation protocol: Yes   Plan:  - reduce heparin to 850 units/hr - 6 hr heparin level  Zera Markwardt, Tsz-Yin 05/25/2015,5:03 PM

## 2015-05-25 NOTE — Progress Notes (Signed)
  Echocardiogram 2D Echocardiogram has been performed.  Kristen RochENNINGTON, Kristen Contreras 05/25/2015, 4:14 PM

## 2015-05-25 NOTE — Progress Notes (Signed)
Pt transported to 3W via bed by Leanna BattlesLeslie S. RN and Madelin RearMichele T. RN   Victorino DikeLeslie Gaynel Schaafsma RN

## 2015-05-25 NOTE — Progress Notes (Signed)
ANTICOAGULATION CONSULT NOTE - Follow-up Consult  Pharmacy Consult for heparin Indication: pulmonary embolism  Allergies  Allergen Reactions  . Penicillins     Patient Measurements: Weight: 183 lb 13.8 oz (83.4 kg) Heparin Dosing Weight: 72.7 kg  Vital Signs: Temp: 97.9 F (36.6 C) (06/05 0401) Temp Source: Oral (06/05 0401) BP: 116/69 mmHg (06/05 0700) Pulse Rate: 87 (06/05 0700)  Labs:  Recent Labs  05/24/15 2030 05/25/15 0541  HGB 13.0 12.2  HCT 36.0 33.4*  PLT 122* 102*  APTT >200*  --   LABPROT 16.3*  --   INR 1.30  --   HEPARINUNFRC 1.14* 1.05*  CREATININE 0.94 0.76    CrCl cannot be calculated (Unknown ideal weight.).  Assessment: 64 yo female on heparin for pulmonary embolism. Heparin level remains elevated at 1.05 on 1150 units/hr. Heparin level elevated x past 3 checks [facility] Hgb down a bit to 12.2, plt down to 102. No bleeding noted.    Goal of Therapy:  Heparin level 0.3-0.7 units/ml Monitor platelets by anticoagulation protocol: Yes   Plan:  - Hold heparin x 90 min - Restart heparin at 950 units/hr - F/u 6 hr heparin level post restart - Daily heparin level and CBC  Christoper Fabianaron Trystan Akhtar, PharmD, BCPS Clinical pharmacist, pager [phone] 05/25/2015,7:17 AM

## 2015-05-26 DIAGNOSIS — D649 Anemia, unspecified: Secondary | ICD-10-CM | POA: Insufficient documentation

## 2015-05-26 DIAGNOSIS — I2699 Other pulmonary embolism without acute cor pulmonale: Secondary | ICD-10-CM

## 2015-05-26 DIAGNOSIS — D696 Thrombocytopenia, unspecified: Secondary | ICD-10-CM | POA: Insufficient documentation

## 2015-05-26 DIAGNOSIS — IMO0002 Reserved for concepts with insufficient information to code with codable children: Secondary | ICD-10-CM | POA: Insufficient documentation

## 2015-05-26 DIAGNOSIS — E1165 Type 2 diabetes mellitus with hyperglycemia: Secondary | ICD-10-CM | POA: Insufficient documentation

## 2015-05-26 LAB — GLUCOSE, CAPILLARY
GLUCOSE-CAPILLARY: 122 mg/dL — AB (ref 65–99)
GLUCOSE-CAPILLARY: 165 mg/dL — AB (ref 65–99)
GLUCOSE-CAPILLARY: 166 mg/dL — AB (ref 65–99)
Glucose-Capillary: 95 mg/dL (ref 65–99)
Glucose-Capillary: 186 mg/dL — ABNORMAL HIGH (ref 65–99)
Glucose-Capillary: 96 mg/dL (ref 65–99)

## 2015-05-26 LAB — PROCALCITONIN: Procalcitonin: <0.1 ng/mL

## 2015-05-26 LAB — CBC WITH DIFFERENTIAL/PLATELET
BASOS ABS: 0 10*3/uL (ref 0.0–0.1)
BASOS PCT: 0 % (ref 0–1)
EOS ABS: 0.1 10*3/uL (ref 0.0–0.7)
Eosinophils Relative: 2 % (ref 0–5)
HCT: 32.2 % — ABNORMAL LOW (ref 36.0–46.0)
Hemoglobin: 11.4 g/dL — ABNORMAL LOW (ref 12.0–15.0)
LYMPHS PCT: 38 % (ref 12–46)
Lymphs Abs: 2.8 10*3/uL (ref 0.7–4.0)
MCH: 31.5 pg (ref 26.0–34.0)
MCHC: 35.4 g/dL (ref 30.0–36.0)
MCV: 89 fL (ref 78.0–100.0)
MONOS PCT: 7 % (ref 3–12)
Monocytes Absolute: 0.5 10*3/uL (ref 0.1–1.0)
NEUTROS ABS: 3.8 10*3/uL (ref 1.7–7.7)
NEUTROS PCT: 52 % (ref 43–77)
PLATELETS: 113 10*3/uL — AB (ref 150–400)
RBC: 3.62 MIL/uL — ABNORMAL LOW (ref 3.87–5.11)
RDW: 14.9 % (ref 11.5–15.5)
WBC: 7.2 10*3/uL (ref 4.0–10.5)

## 2015-05-26 LAB — BASIC METABOLIC PANEL
Anion gap: 5 (ref 5–15)
BUN: <5 mg/dL — ABNORMAL LOW (ref 6–20)
CHLORIDE: 113 mmol/L — AB (ref 101–111)
CO2: 24 mmol/L (ref 22–32)
Calcium: 8.1 mg/dL — ABNORMAL LOW (ref 8.9–10.3)
Creatinine, Ser: 0.81 mg/dL (ref 0.44–1.00)
GFR calc non Af Amer: >60 mL/min (ref >60)
GLUCOSE: 124 mg/dL — AB (ref 65–99)
Potassium: 3.5 mmol/L (ref 3.5–5.1)
SODIUM: 142 mmol/L (ref 135–145)

## 2015-05-26 LAB — HEMOGLOBIN A1C
Hgb A1c MFr Bld: 11.5 % — ABNORMAL HIGH (ref 4.8–5.6)
Mean Plasma Glucose: 283 mg/dL

## 2015-05-26 LAB — MAGNESIUM: MAGNESIUM: 1.8 mg/dL (ref 1.7–2.4)

## 2015-05-26 LAB — HEPARIN LEVEL (UNFRACTIONATED)
HEPARIN UNFRACTIONATED: 0.38 [IU]/mL (ref 0.30–0.70)
Heparin Unfractionated: 0.54 IU/mL (ref 0.30–0.70)

## 2015-05-26 LAB — PHOSPHORUS: Phosphorus: 2.7 mg/dL (ref 2.5–4.6)

## 2015-05-26 MED ORDER — POTASSIUM CHLORIDE CRYS ER 20 MEQ PO TBCR
40.0000 meq | EXTENDED_RELEASE_TABLET | Freq: Once | ORAL | Status: AC
  Administered 2015-05-26: 40 meq via ORAL
  Filled 2015-05-26: qty 2

## 2015-05-26 MED ORDER — MAGNESIUM SULFATE 2 GM/50ML IV SOLN
2.0000 g | Freq: Once | INTRAVENOUS | Status: AC
  Administered 2015-05-26: 2 g via INTRAVENOUS
  Filled 2015-05-26: qty 50

## 2015-05-26 MED ORDER — INSULIN ASPART 100 UNIT/ML ~~LOC~~ SOLN: 0.0000 [IU] | Freq: Every day | SUBCUTANEOUS | Status: DC

## 2015-05-26 MED ORDER — INSULIN ASPART 100 UNIT/ML ~~LOC~~ SOLN
0.0000 [IU] | Freq: Three times a day (TID) | SUBCUTANEOUS | Status: DC
  Administered 2015-05-27 (×2): 2 [IU] via SUBCUTANEOUS
  Administered 2015-05-27: 1 [IU] via SUBCUTANEOUS
  Administered 2015-05-28: 3 [IU] via SUBCUTANEOUS
  Administered 2015-05-28: 2 [IU] via SUBCUTANEOUS
  Administered 2015-05-28 – 2015-05-29 (×2): 3 [IU] via SUBCUTANEOUS
  Administered 2015-05-29: 5 [IU] via SUBCUTANEOUS

## 2015-05-26 MED ORDER — LIVING WELL WITH DIABETES BOOK
Freq: Once | Status: DC
  Filled 2015-05-26 (×2): qty 1

## 2015-05-26 MED ORDER — INSULIN STARTER KIT- PEN NEEDLES (ENGLISH)
1.0000 | Freq: Once | Status: AC
  Administered 2015-05-26: 1
  Filled 2015-05-26: qty 1

## 2015-05-26 MED FILL — Heparin Sodium (Porcine) 100 Unt/ML in Sodium Chloride 0.45%: INTRAMUSCULAR | Qty: 250 | Status: AC

## 2015-05-26 NOTE — Progress Notes (Addendum)
PROGRESS NOTE    Kristen Contreras:[id number] DOB: 1951-04-07 DOA: 05/24/2015 PCP: No primary care provider on file.  HPI/Brief narrative 64 year old female patient, no significant past medical history, admitted to [facility] on 6/1 with DKA after she presented for 1 week of not feeling well. Once she was stabilized, she was transferred to the floor for further management. She then developed several episodes of presyncope which resulted in a CTA chest being done that revealed large PE. She was transferred to [facility] for consideration of advanced therapy. She was placed on IV heparin drip. Post stabilization, patient transferred to floor under [facility] service on 05/26/15/   Assessment/Plan:  Submassive PE - PESI score -2 at admit-low risk but still with RV strain and lactic acidosis - Started on IV heparin GTT on 05/24/15 and pulmonology notes recommends 3-5 days IV Heparin followed by transition to NOAC- Xarelto at DC. Discussed with Dr. Marchelle Gearing who suggests 5 days of IV heparin given clot burden. - Pulmonology & myself discussed with patient regarding pros and cons of Coumadin versus NOAC and patient prefers NOAC - Length of treatment: 6-24 months at full dose - Follow-up with Dr. Marchelle Gearing post discharge 1 - No personal or family history of VTE. Was recently on a cruise trip to the [place].? Unprovoked - Consider outpatient hematology consultation for further evaluation  DKA in newly diagnosed diabetic - CBG is mildly uncontrolled and fluctuating on Lantus and SSI - DKA resolved - A1c 11.5 - We'll likely DC home on Lantus and NovoLog at least for the short-term.  Hypokalemia - Improved. Replace and follow up as needed  Hypomagnesemia - Replaced  Anemia and thrombocytopenia - Unclear etiology. Follow CBCs  Mediastinal mass and lung nodule - Reported on outside CT but this image not available to [place] - Dr. Marchelle Gearing will follow and address as outpatient  Lactic  acidosis - Likely related to submassive PE - Treated with IV fluids and improved - Follow lactate 05/27/15    DVT prophylaxis: Currently on IV heparin anticoagulation Code Status: Full Family Communication: Discussed with spouse at bedside Disposition Plan: DC home in 48 hours   Consultants:  CCM-signed off 6/6  Procedures:  None  Antibiotics:  None   Subjective: Denies complaints. Denies chest pain, dyspnea or palpitations.  Objective: Filed Vitals:   05/26/15 0419 05/26/15 0721 05/26/15 0740 05/26/15 1000  BP: 112/67  132/83   Pulse: 87  95   Temp: 98.3 F (36.8 C)  98.1 F (36.7 C)   TempSrc: Oral  Oral   Resp: 18  16   Height:     (1.626 m)  Weight:  83.734 kg (184 lb 9.6 oz)    SpO2: 93%  97%     Intake/Output Summary (Last 24 hours) at 05/26/15 1337 Last data filed at 05/26/15 0900  Gross per 24 hour  Intake 531.32 ml  Output    550 ml  Net -18.68 ml   Filed Weights   05/24/15 1854 05/25/15 0500 05/26/15 0721  Weight: 83 kg (182 lb 15.7 oz) 83.4 kg (183 lb 13.8 oz) 83.734 kg (184 lb 9.6 oz)     Exam:  General exam: Pleasant middle-aged female lying comfortably supine in bed. Respiratory system: Clear. No increased work of breathing. Cardiovascular system: S1 & S2 heard, RRR. No JVD, murmurs, gallops, clicks or pedal edema. Telemetry: Sinus rhythm-ST 100s. Gastrointestinal system: Abdomen is nondistended, soft and nontender. Normal bowel sounds heard. Central nervous system: Alert and oriented. No  focal neurological deficits. Extremities: Symmetric 5 x 5 power.   Data Reviewed: Basic Metabolic Panel:  Recent Labs Lab 05/24/15 2030 05/25/15 0541 05/25/15 1315 05/26/15 0250  NA 137 140 140 142  K 2.7* 2.9* 4.6 3.5  CL 108 111 110 113*  CO2 22 20* 22 24  GLUCOSE 189* 100* 214* 124*  BUN 7 7 7  <5*  CREATININE 0.94 0.76 0.79 0.81  CALCIUM 8.3* 8.1* 8.6* 8.1*  MG 1.8  --  2.3 1.8  PHOS 1.2*  --  2.6 2.7   Liver Function  Tests:  Recent Labs Lab 05/24/15 2030  AST 19  ALT 14  ALKPHOS 91  BILITOT 0.5  PROT 5.1*  ALBUMIN 2.7*   No results for input(s): LIPASE, AMYLASE in the last 168 hours. No results for input(s): AMMONIA in the last 168 hours. CBC:  Recent Labs Lab 05/24/15 2030 05/25/15 0541 05/26/15 0250  WBC 10.7* 9.6 7.2  NEUTROABS  --   --  3.8  HGB 13.0 12.2 11.4*  HCT 36.0 33.4* 32.2*  MCV 84.9 85.0 89.0  PLT 122* 102* 113*   Cardiac Enzymes:  Recent Labs Lab 05/25/15 1315  TROPONINI 0.07*   BNP (last 3 results) No results for input(s): PROBNP in the last 8760 hours. CBG:  Recent Labs Lab 05/25/15 2102 05/26/15 0050 05/26/15 0424 05/26/15 0742 05/26/15 1138  GLUCAP 230* 165* 95 122* 186*    Recent Results (from the past 240 hour(s))  MRSA PCR Screening     Status: Abnormal   Collection Time: 05/24/15  8:13 PM  Result Value Ref Range Status   MRSA by PCR POSITIVE (A) NEGATIVE Final    Comment:        The GeneXpert MRSA Assay (FDA approved for NASAL specimens only), is one component of a comprehensive MRSA colonization surveillance program. It is not intended to diagnose MRSA infection nor to guide or monitor treatment for MRSA infections. RESULT CALLED TO, READ BACK BY AND VERIFIED WITHErby Pian: C SMITH RN [phone] 05/24/15 A BROWNING          Studies: Dg Chest Port 1 View  05/25/2015   CLINICAL DATA:  Pulmonary embolism.  EXAM: PORTABLE CHEST - 1 VIEW  COMPARISON:  None.  FINDINGS: Normal mediastinum and cardiac silhouette. Normal pulmonary vasculature. No evidence of effusion, infiltrate, or pneumothorax. No acute bony abnormality.  IMPRESSION: No acute cardiopulmonary process.   Electronically Signed   By: Genevive BiStewart  Edmunds M.D.   On: 05/25/2015 15:12        Scheduled Meds: . Chlorhexidine Gluconate Cloth  6 each Topical Q0600  . insulin aspart  0-15 Units Subcutaneous 6 times per day  . insulin glargine  5 Units Subcutaneous Daily  . magnesium sulfate 1 -  4 g bolus IVPB  2 g Intravenous Once  . mupirocin ointment  1 application Nasal BID  . pantoprazole  40 mg Oral Daily  . pneumococcal 23 valent vaccine  0.5 mL Intramuscular Tomorrow-1000   Continuous Infusions: . heparin 900 Units/hr (05/26/15 0040)    Active Problems:   Pulmonary embolism   Mediastinal mass   DKA (diabetic ketoacidosis)   Hypophosphatemia   Hypomagnesemia    Time spent: 30 minutes.    Marcellus ScottHONGALGI,Jourdyn Hasler, MD, FACP, FHM. [facility] Pager [phone]  If 7PM-7AM, please contact night-coverage [email or web address] Password [email or web address] 05/26/2015, 1:37 PM    LOS: 2 days

## 2015-05-26 NOTE — Progress Notes (Signed)
Utilization review complete. Cady Hafen RN CCM Case Mgmt phone [phone] 

## 2015-05-26 NOTE — Plan of Care (Signed)
Problem: Food- and Nutrition-Related Knowledge Deficit (NB-1.1) Goal: Nutrition education Formal process to instruct or train a patient/client in a skill or to impart knowledge to help patients/clients voluntarily manage or modify food choices and eating behavior to maintain or improve health. Outcome: Completed/Met Date Met:  05/26/15  RD consulted for nutrition education regarding diabetes. Patient reports that she counts carbs to control her weight. She has been doing this for many years. She eats </= 30 gm of carbs with each meal.     Lab Results  Component Value Date    HGBA1C 11.5* 05/24/2015    RD provided "Carbohydrate Counting for People with Diabetes" handout from the Academy of Nutrition and Dietetics. Discussed different food groups and their effects on blood sugar, emphasizing carbohydrate-containing foods. Provided list of carbohydrates and recommended serving sizes of common foods.  Discussed importance of controlled and consistent carbohydrate intake throughout the day. Provided examples of ways to balance meals/snacks and encouraged intake of high-fiber, whole grain complex carbohydrates. Teach back method used.  Expect good compliance.  Body mass index is 31.67 kg/(m^2). Pt meets criteria for class 1 obesity based on current BMI.  Current diet order is CHO-modified heart healthy, patient is consuming approximately 100% of meals at this time. Labs and medications reviewed. No further nutrition interventions warranted at this time. RD contact information provided. If additional nutrition issues arise, please re-consult RD.  Molli Barrows, RD, LDN, [place] Pager [phone] After Hours Pager [phone]

## 2015-05-26 NOTE — Progress Notes (Signed)
ANTICOAGULATION CONSULT NOTE - Follow Up Consult  Pharmacy Consult for Heparin Indication: pulmonary embolus and DVT  Allergies  Allergen Reactions  . Penicillins Itching and Rash    Patient Measurements: Height: 5\' 4"  (162.6 cm) Weight: 184 lb 9.6 oz (83.734 kg) IBW/kg (Calculated) : 54.7 Heparin Dosing Weight: 73 kg  Vital Signs: Temp: 98.1 F (36.7 C) (06/06 0740) Temp Source: Oral (06/06 0740) BP: 132/83 mmHg (06/06 0740) Pulse Rate: 95 (06/06 0740)  Labs:  Recent Labs  05/24/15 2030 05/25/15 0541 05/25/15 1315 05/25/15 1612 05/25/15 2339 05/26/15 0250 05/26/15 0724  HGB 13.0 12.2  --   --   --  11.4*  --   HCT 36.0 33.4*  --   --   --  32.2*  --   PLT 122* 102*  --   --   --  113*  --   APTT >200*  --   --   --   --   --   --   LABPROT 16.3*  --   --   --   --   --   --   INR 1.30  --   --   --   --   --   --   HEPARINUNFRC 1.14* 1.05*  --  0.76* 0.38  --  0.54  CREATININE 0.94 0.76 0.79  --   --  0.81  --   TROPONINI  --   --  0.07*  --   --   --   --     Estimated Creatinine Clearance: 75.4 mL/min (by C-G formula based on Cr of 0.81).  Assessment:   Acute saddle PE.  [facility]15 due to right heart strain.  Right leg DVT per dopplers 6/5.   Heparin level is therapeutic today (0.54) on 900 units/hr.   CBC about the same. Platelet count low. No bleeding noted.  Goal of Therapy:  Heparin level 0.3-0.7 units/ml Monitor platelets by anticoagulation protocol: Yes   Plan:   Continue heparin drip at 900 units/hr.  Daily heparin level and CBC.  Follow up for oral anticoagulation.  Dennie FettersEgan, Antonieta Slaven Donovan, RPh Pager: [phone] 05/26/2015,10:55 AM

## 2015-05-26 NOTE — Progress Notes (Signed)
Rounding Note  Spoke with pt about new diabetes diagnosis. Patient experienced hyperglycemia symptoms (lethargy, thirsty) 1 week before admission. Discussed what an A1C is, basic pathophysiology of DM Type 2, basic home care, importance of checking CBGs and maintaining good CBG control to prevent long-term and short-term complications. Discussed impact of nutrition, exercise, stress, sickness, and medications on diabetes control. Discussed carbohydrates, carbohydrate goals per day and meal, along with portion sizes. Reviewed signs and symptoms of hyperglycemia and hypoglycemia along with treatment for both. RNs to provide ongoing basic DM education at bedside with this patient and engage patient to actively check blood glucose and administer insulin injections. Have ordered educational booklet, insulin starter kit, and DM videos.   Due to A1c level patient may need to be discharged on insulin.  Educated patient and spouse on insulin pen use at home. Reviewed contents of insulin flexpen starter kit. Reviewed all steps if insulin pen including attachment of needle, 2-unit air shot, dialing up dose, giving injection, removing needle, disposal of sharps, storage of unused insulin, disposal of insulin etc. Patient able to provide successful return demonstration. Also reviewed troubleshooting with insulin pen. CM checking insurance coverage for insulin pen. MD to give patient Rxs for insulin pens and insulin pen needles.   MD at time of discharge patient needs: Glucose meter kit (order # [id number]) Insulin pen Insulin pen needles (order # [phone]) Possible outpatient education referral if patient wants  Thanks,  Tama Headings RN, MSN, [facility] Inpatient Diabetes Coordinator Team Pager [phone]

## 2015-05-26 NOTE — Progress Notes (Signed)
PULMONARY / CRITICAL CARE MEDICINE   Name: Kristen Contreras MRN: [id number] DOB: 26-Mar-1951    ADMISSION DATE:  05/24/2015  PRIMARY SERVICE: PCCM  CHIEF COMPLAINT:  PE  BRIEF PATIENT DESCRIPTION:  Kristen Contreras is a 66 F with no significant previous PMH who was admitted to [facility] on 6/1 with DKA which was detected after she presented for 1 week of not feeling well. Her sugars were stabilized and she was transferred to the floor for further management. This morning, she had one of several recent episodes of presyncope which resulting in a CTA being done which detected a large PE. She was transferred for consideration of advanced therapy.   She has minimal current complaints. She feels "puffy" and believes she has gotten 20 L fluid recently. She denies chest pain, fevers, chills, cough, and abdominal pain. She notes that she has not had any preventative medical care since she had her C-section approximately 26 yrs ago. She takes a PPI for self-diagnosed GERD.     reports that she has never smoked. She does not have any smokeless tobacco history on file.   has a past medical history of DM (diabetes mellitus); Pulmonary embolism; and GERD (gastroesophageal reflux disease).   LINES / TUBES: PIVs  CULTURES: Blood cultures at [place]: None   SIGNIFICANT EVENTS / STUDIES:  PE Protocol CT [facility]>>> Large Saddle PE with RV/LV ratio of 2.2 and 7 mm LLL and mediastinal mass, Pericholecystic Fluid BLE dopplers>>> L post tibial acute DVT    SUBJECTIVE:  Feeling much better.  Wants to get OOB.  Wondering when she can go home.  Denies SOB, chest pain.  HR 80's.  On RA.    VITAL SIGNS: Temp:  [97.9 F (36.6 C)-98.9 F (37.2 C)] 98.1 F (36.7 C) (06/06 0740) Pulse Rate:  [87-106] 95 (06/06 0740) Resp:  [11-24] 16 (06/06 0740) BP: (103-132)/(58-83) 132/83 mmHg (06/06 0740) SpO2:  [93 %-98 %] 97 % (06/06 0740) Weight:  [184 lb 9.6 oz (83.734 kg)] 184 lb 9.6 oz (83.734 kg) (06/06  0721) HEMODYNAMICS:   VENTILATOR SETTINGS:   INTAKE / OUTPUT: Intake/Output      06/05 0701 - 06/06 0700 06/06 0701 - 06/07 0700   P.O. 240 160   I.V. (mL/kg) 162.3 (1.9)    Total Intake(mL/kg) 402.3 (4.8) 160 (1.9)   Urine (mL/kg/hr) 550 (0.3)    Emesis/NG output 0 (0)    Stool 0 (0)    Total Output 550     Net -147.7 +160        Stool Occurrence 0 x    Emesis Occurrence 0 x      PHYSICAL EXAMINATION: General:  NAD. Looks well Neuro:  Intact HEENT:  Sclera anicteric, conjunctiva pink, MMM, OP [place] Neck: Trachea supple and midline, (-) LAN or JVD Cardiovascular:  RRR, nS1/S2, (-) MRG Lungs:  resps even non labored on RA, CTAB Abdomen:  S/NT/ND/(+)BS, no Murhpy's sign Musculoskeletal:  Warm and dry, no sig edema  Skin:  Intact  LABS:   Recent Labs Lab 05/24/15 2030 05/25/15 0541 05/26/15 0250  HGB 13.0 12.2 11.4*  HCT 36.0 33.4* 32.2*  WBC 10.7* 9.6 7.2  PLT 122* 102* 113*    Recent Labs Lab 05/24/15 2030 05/25/15 0541 05/25/15 1315 05/26/15 0250  NA 137 140 140 142  K 2.7* 2.9* 4.6 3.5  CL 108 111 110 113*  CO2 22 20* 22 24  GLUCOSE 189* 100* 214* 124*  BUN 7 7 7  <5*  CREATININE 0.94 0.76 0.79 0.81  CALCIUM 8.3* 8.1* 8.6* 8.1*  MG 1.8  --  2.3 1.8  PHOS 1.2*  --  2.6 2.7    COAGULATION  Recent Labs Lab 05/24/15 2030  INR 1.30    CARDIAC    Recent Labs Lab 05/25/15 1315  TROPONINI 0.07*   No results for input(s): PROBNP in the last 168 hours.   INFECTIOUS  Recent Labs Lab 05/25/15 1315 05/25/15 1855 05/26/15 0250  LATICACIDVEN 3.7* 2.9*  --   PROCALCITON <0.10  --  <0.10    IMAGING x48h  Dg Chest Port 1 View  05/25/2015   CLINICAL DATA:  Pulmonary embolism.  EXAM: PORTABLE CHEST - 1 VIEW  COMPARISON:  None.  FINDINGS: Normal mediastinum and cardiac silhouette. Normal pulmonary vasculature. No evidence of effusion, infiltrate, or pneumothorax. No acute bony abnormality.  IMPRESSION: No acute cardiopulmonary process.    Electronically Signed   By: Suzy Bouchard M.D.   On: 05/25/2015 15:12    EKG: None included in transfer packet   ASSESSMENT / PLAN:  Acute PE with evidence of right heart strain: No indication for intervention with HD stable, improving lactate. But Severe RV strain certainly concerning. PESI class 2 Low risk at admit, Currently improved to Class 1 Pulmonary nodule and mediastinal mass: Can address once plan for PE Rx established. ? Residual thymus tissue for mediastinal mass. Pulmonary nodule low risk for malignancy given absent smoking histroy RV strain per echo - severe  Elevated lactic acid - likely from PE.  Improving.  Hypokalemia -severe DKA Pericholecystic Fluid with elavated ALK phos:  No evidence of cholecystitis on exam. LFT's normal P:   Address mediastinal mass when less acute Repeat CT scan in 6-12 months and then if no change in 18-24 months Replete electrolytes PRN  F/u chem  Continue heparin gtt - goal 3-5 days total IV heparin  Start oral anticoagulation - coumadin v NOAC  DM consult  SSI, lantus    Pt improving, now on floor on [facility] service.  Ready for oral anticoagulation.  Lactate improved.    PCCM signing off please call back if needed.    Nickolas Madrid, NP 05/26/2015  11:46 AM Pager: [phone] or [phone]

## 2015-05-26 NOTE — Progress Notes (Signed)
ANTICOAGULATION CONSULT NOTE - Follow Up Consult  Pharmacy Consult for heparin Indication: pulmonary embolus  Labs:  Recent Labs  05/24/15 2030 05/25/15 0541 05/25/15 1315 05/25/15 1612 05/25/15 2339  HGB 13.0 12.2  --   --   --   HCT 36.0 33.4*  --   --   --   PLT 122* 102*  --   --   --   APTT >200*  --   --   --   --   LABPROT 16.3*  --   --   --   --   INR 1.30  --   --   --   --   HEPARINUNFRC 1.14* 1.05*  --  0.76* 0.38  CREATININE 0.94 0.76 0.79  --   --   TROPONINI  --   --  0.07*  --   --      Assessment: 64yo female now subtherapeutic on heparin after rate decreases though now at lower end of goal, would prefer higher w/ large PE.  Goal of Therapy:  Heparin level 0.3-0.7 units/ml   Plan:  Will increase heparin gtt very slightly to 900 units/hr and check level in 6hr.  Vernard GamblesVeronda Yazmin Locher, PharmD, BCPS  05/26/2015,12:20 AM

## 2015-05-27 ENCOUNTER — Encounter (HOSPITAL_COMMUNITY): Payer: Self-pay | Admitting: General Practice

## 2015-05-27 DIAGNOSIS — I824Z1 Acute embolism and thrombosis of unspecified deep veins of right distal lower extremity: Secondary | ICD-10-CM

## 2015-05-27 LAB — BASIC METABOLIC PANEL
ANION GAP: 5 (ref 5–15)
BUN: 5 mg/dL — AB (ref 6–20)
CALCIUM: 8.4 mg/dL — AB (ref 8.9–10.3)
CO2: 30 mmol/L (ref 22–32)
Chloride: 106 mmol/L (ref 101–111)
Creatinine, Ser: 0.74 mg/dL (ref 0.44–1.00)
GFR calc Af Amer: >60 mL/min (ref >60)
GLUCOSE: 145 mg/dL — AB (ref 65–99)
Potassium: 4.3 mmol/L (ref 3.5–5.1)
Sodium: 141 mmol/L (ref 135–145)

## 2015-05-27 LAB — GLUCOSE, CAPILLARY
GLUCOSE-CAPILLARY: 149 mg/dL — AB (ref 65–99)
GLUCOSE-CAPILLARY: 197 mg/dL — AB (ref 65–99)
Glucose-Capillary: 194 mg/dL — ABNORMAL HIGH (ref 65–99)
Glucose-Capillary: 192 mg/dL — ABNORMAL HIGH (ref 65–99)

## 2015-05-27 LAB — CBC WITH DIFFERENTIAL/PLATELET
Basophils Absolute: 0 10*3/uL (ref 0.0–0.1)
Basophils Relative: 0 % (ref 0–1)
EOS ABS: 0.2 10*3/uL (ref 0.0–0.7)
Eosinophils Relative: 3 % (ref 0–5)
HCT: 32.5 % — ABNORMAL LOW (ref 36.0–46.0)
HEMOGLOBIN: 11.2 g/dL — AB (ref 12.0–15.0)
Lymphocytes Relative: 36 % (ref 12–46)
Lymphs Abs: 2.3 10*3/uL (ref 0.7–4.0)
MCH: 31 pg (ref 26.0–34.0)
MCHC: 34.5 g/dL (ref 30.0–36.0)
MCV: 90 fL (ref 78.0–100.0)
Monocytes Absolute: 0.5 10*3/uL (ref 0.1–1.0)
Monocytes Relative: 8 % (ref 3–12)
NEUTROS PCT: 53 % (ref 43–77)
Neutro Abs: 3.4 10*3/uL (ref 1.7–7.7)
PLATELETS: 138 10*3/uL — AB (ref 150–400)
RBC: 3.61 MIL/uL — AB (ref 3.87–5.11)
RDW: 15 % (ref 11.5–15.5)
WBC: 6.3 10*3/uL (ref 4.0–10.5)

## 2015-05-27 LAB — TROPONIN I: TROPONIN I: 0.05 ng/mL — AB (ref <0.031)

## 2015-05-27 LAB — PHOSPHORUS: PHOSPHORUS: 3.8 mg/dL (ref 2.5–4.6)

## 2015-05-27 LAB — MAGNESIUM: MAGNESIUM: 1.8 mg/dL (ref 1.7–2.4)

## 2015-05-27 LAB — HEPARIN LEVEL (UNFRACTIONATED): Heparin Unfractionated: 0.34 IU/mL (ref 0.30–0.70)

## 2015-05-27 LAB — PROCALCITONIN: Procalcitonin: <0.1 ng/mL

## 2015-05-27 LAB — LACTIC ACID, PLASMA: Lactic Acid, Venous: 1.4 mmol/L (ref 0.5–2.0)

## 2015-05-27 MED ORDER — ACETAMINOPHEN 325 MG PO TABS: 650.0000 mg | ORAL_TABLET | Freq: Four times a day (QID) | ORAL | Status: DC | PRN

## 2015-05-27 NOTE — Progress Notes (Signed)
ANTICOAGULATION CONSULT NOTE - Follow Up Consult  Pharmacy Consult for Heparin Indication: pulmonary embolus and DVT  Allergies  Allergen Reactions  . Penicillins Itching and Rash    Patient Measurements: Height: 5\' 4"  (162.6 cm) Weight: 186 lb 6.4 oz (84.55 kg) IBW/kg (Calculated) : 54.7 Heparin Dosing Weight: 74 kg  Vital Signs: Temp: 98.6 F (37 C) (06/07 0744) Temp Source: Oral (06/07 0744) BP: 117/78 mmHg (06/07 0459) Pulse Rate: 98 (06/07 0459)  Labs:  Recent Labs  05/24/15 2030 05/25/15 0541 05/25/15 1315  05/25/15 2339 05/26/15 0250 05/26/15 0724 05/27/15 0325  HGB 13.0 12.2  --   --   --  11.4*  --  11.2*  HCT 36.0 33.4*  --   --   --  32.2*  --  32.5*  PLT 122* 102*  --   --   --  113*  --  138*  APTT >200*  --   --   --   --   --   --   --   LABPROT 16.3*  --   --   --   --   --   --   --   INR 1.30  --   --   --   --   --   --   --   HEPARINUNFRC 1.14* 1.05*  --   < > 0.38  --  0.54 0.34  CREATININE 0.94 0.76 0.79  --   --  0.81  --  0.74  TROPONINI  --   --  0.07*  --   --   --   --  0.05*  < > = values in this interval not displayed.  Estimated Creatinine Clearance: 76.8 mL/min (by C-G formula based on Cr of 0.74).  Assessment:   Acute saddle PE.  [facility]15 due to right heart strain.  Right leg DVT per dopplers 6/5.   Heparin level remains therapeutic today (0.34) on 900 units/hr, but down from 0.54 on same rate yesterday.   CBC about the same. Platelet count low but slight trend up. No bleeding noted.   To stay on IV heparin for 5 days due to clot burden, then change to NOAC, likely Xarelto.   [facility] ~9am on 6/4, so has been on for 72 hrs as of this morning.  Goal of Therapy:  Heparin level 0.3-0.7 units/ml Monitor platelets by anticoagulation protocol: Yes   Plan:   Increase heparin drip to 1000 units/hr, to try to keep level in target range.  Daily heparin level and CBC.  Transition to NOAC planned for 6/9, after 5 days of IV heparin.  Dennie FettersEgan, Malikhi Ogan Donovan, RPh Pager: [phone] 05/27/2015,10:45 AM

## 2015-05-27 NOTE — Progress Notes (Signed)
PROGRESS NOTE    Kristen Contreras ZOX:[id number] DOB: May 11, 1951 DOA: 05/24/2015 PCP: No PCP Per Patient  HPI/Brief narrative 64 year old female patient, no significant past medical history, admitted to [facility] on 6/1 with DKA after she presented for 1 week of not feeling well. Once she was stabilized, she was transferred to the floor for further management. She then developed several episodes of presyncope which resulted in a CTA chest being done that revealed large PE. She was transferred to [facility] ICU for consideration of advanced therapy. She was placed on IV heparin drip. Post stabilization, patient transferred to floor under TRH service on 05/26/15.   Assessment/Plan:  Submassive PE/acute DVT of R posterior tibial vein  - PESI score -2 at admit-low risk but still with RV strain and lactic acidosis - Started on IV heparin GTT on 05/24/15 and pulmonology notes recommends 3-5 days IV Heparin followed by transition to NOAC- Xarelto at DC. Discussed with Dr. Marchelle Gearing on 6/6 who recommended 5 days of IV heparin given clot burden- day 4/5 today. Can be switched to PO Xarelto and DC'ed home on 05/29/2015. - Pulmonology & myself discussed with patient regarding pros and cons of Coumadin versus NOAC and patient prefers NOAC - Length of treatment: 6-24 months at full dose - Follow-up with Dr. Marchelle Gearing post discharge 1 - No personal or family history of VTE. Was recently on a cruise trip to the [place] Unprovoked - Consider outpatient hematology consultation for further evaluation - Mildly elevated troponin likely secondary to PE and right heart strain. Trending down.  DKA in newly diagnosed diabetic - CBG is mildly uncontrolled and fluctuating on Lantus and SSI - DKA resolved - A1c 11.5 - We'll likely DC home on Lantus and NovoLog at least for the short-term or 70/30 insulin depending on insurance/affordability.  Hypokalemia/hypomagnesemia/hypophosphatemia - Replaced  Anemia  and thrombocytopenia - Unclear etiology. Follow CBCs - Stable  Mediastinal mass and lung nodule - Reported on outside CT but this image not available to [place] - Dr. Marchelle Gearing will follow and address as outpatient  Lactic acidosis - Likely related to submassive PE - Treated with IV fluids and improved - Resolved     DVT prophylaxis: Currently on IV heparin anticoagulation Code Status: Full Family Communication: Discussed with spouse at bedside Disposition Plan: DC home possibly on 05/29/15   Consultants:  CCM-signed off 6/6  Procedures:  2 D Echo: Study Conclusions  - Left ventricle: The cavity size was normal. Wall thickness was normal. Systolic function was normal. The estimated ejection fraction was in the range of 60% to 65%. D-shaped septum consistent with right ventricular pressure/ volume overload. Wall motion was normal; there were no regional wall motion abnormalities. Doppler parameters are consistent with abnormal left ventricular relaxation (grade 1 diastolic dysfunction). - Right ventricle: The cavity size was moderately to severely dilated. Wall thickness was normal. Systolic function was severely reduced. Apical contraction relatively spared (McConnell&'s sign) - Right atrium: The atrium was mildly dilated. - Pulmonary arteries: Systolic pressure was moderately increased. PA peak pressure: 50 mm Hg (S).  Bilateral lower extremity venous Dopplers: Summary: Findings consistent with acute deep vein thrombosis involving the posterior tibial vein of the right lower extremity.  Antibiotics:  None   Subjective: Denies chest pain or dyspnea. Complaints of some bilateral leg edema. No bleeding.  Objective: Filed Vitals:   05/27/15 0400 05/27/15 0459 05/27/15 0744 05/27/15 1124  BP: 131/74 117/78    Pulse: 88 98    Temp: 98.7 F (37.1  C) 98.6 F (37 C) 98.6 F (37 C) 98.2 F (36.8 C)  TempSrc:  Oral Oral Oral  Resp: 17       Height:      Weight: 84.55 kg (186 lb 6.4 oz)     SpO2: 97% 98%  96%    Intake/Output Summary (Last 24 hours) at 05/27/15 1348 Last data filed at 05/27/15 1300  Gross per 24 hour  Intake 1029.85 ml  Output   2525 ml  Net -1495.15 ml   Filed Weights   05/25/15 0500 05/26/15 0721 05/27/15 0400  Weight: 83.4 kg (183 lb 13.8 oz) 83.734 kg (184 lb 9.6 oz) 84.55 kg (186 lb 6.4 oz)     Exam:  General exam: Pleasant middle-aged female lying comfortably supine in bed. Respiratory system: Clear. No increased work of breathing. Cardiovascular system: S1 & S2 heard, RRR. No JVD, murmurs, gallops, clicks . Trace bilateral leg edema. Telemetry: Sinus rhythm. Gastrointestinal system: Abdomen is nondistended, soft and nontender. Normal bowel sounds heard. Central nervous system: Alert and oriented. No focal neurological deficits. Extremities: Symmetric 5 x 5 power.   Data Reviewed: Basic Metabolic Panel:  Recent Labs Lab 05/24/15 2030 05/25/15 0541 05/25/15 1315 05/26/15 0250 05/27/15 0325  NA 137 140 140 142 141  K 2.7* 2.9* 4.6 3.5 4.3  CL 108 111 110 113* 106  CO2 22 20* GLUCOSE 189* 100* 214* 124* 145*  BUN <5* 5*  CREATININE 0.94 0.76 0.79 0.81 0.74  CALCIUM 8.3* 8.1* 8.6* 8.1* 8.4*  MG 1.8  --  2.3 1.8 1.8  PHOS 1.2*  --  2.6 2.7 3.8   Liver Function Tests:  Recent Labs Lab 05/24/15 2030  AST 19  ALT 14  ALKPHOS 91  BILITOT 0.5  PROT 5.1*  ALBUMIN 2.7*   No results for input(s): LIPASE, AMYLASE in the last 168 hours. No results for input(s): AMMONIA in the last 168 hours. CBC:  Recent Labs Lab 05/24/15 2030 05/25/15 0541 05/26/15 0250 05/27/15 0325  WBC 10.7* 9.6 7.2 6.3  NEUTROABS  --   --  3.8 3.4  HGB 13.0 12.2 11.4* 11.2*  HCT 36.0 33.4* 32.2* 32.5*  MCV 84.9 85.0 89.0 90.0  PLT 122* 102* 113* 138*   Cardiac Enzymes:  Recent Labs Lab 05/25/15 1315 05/27/15 0325  TROPONINI 0.07* 0.05*   BNP (last 3 results) No results  for input(s): PROBNP in the last 8760 hours. CBG:  Recent Labs Lab 05/26/15 1138 05/26/15 1654 05/26/15 2103 05/27/15 0746 05/27/15 1125  GLUCAP 186* 96 166* 149* 197*    Recent Results (from the past 240 hour(s))  MRSA PCR Screening     Status: Abnormal   Collection Time: 05/24/15  8:13 PM  Result Value Ref Range Status   MRSA by PCR POSITIVE (A) NEGATIVE Final    Comment:        The GeneXpert MRSA Assay (FDA approved for NASAL specimens only), is one component of a comprehensive MRSA colonization surveillance program. It is not intended to diagnose MRSA infection nor to guide or monitor treatment for MRSA infections. RESULT CALLED TO, READ BACK BY AND VERIFIED WITHErby Pian RN 1610 05/24/15 A BROWNING          Studies: Dg Chest Port 1 View  05/25/2015   CLINICAL DATA:  Pulmonary embolism.  EXAM: PORTABLE CHEST - 1 VIEW  COMPARISON:  None.  FINDINGS: Normal mediastinum and cardiac silhouette. Normal pulmonary vasculature. No evidence of  effusion, infiltrate, or pneumothorax. No acute bony abnormality.  IMPRESSION: No acute cardiopulmonary process.   Electronically Signed   By: Genevive BiStewart  Edmunds M.D.   On: 05/25/2015 15:12        Scheduled Meds: . Chlorhexidine Gluconate Cloth  6 each Topical Q0600  . insulin aspart  0-5 Units Subcutaneous QHS  . insulin aspart  0-9 Units Subcutaneous TID WC  . insulin glargine  5 Units Subcutaneous Daily  . living well with diabetes book   Does not apply Once  . mupirocin ointment  1 application Nasal BID  . pantoprazole  40 mg Oral Daily   Continuous Infusions: . heparin 1,000 Units/hr (05/27/15 1051)    Active Problems:   Pulmonary embolism   Mediastinal mass   DKA (diabetic ketoacidosis)   Hypophosphatemia   Hypomagnesemia   Absolute anemia   Thrombocytopenia   DM (diabetes mellitus), type 2, uncontrolled    Time spent: 30 minutes.    Marcellus ScottHONGALGI,Keeana Pieratt, MD, FACP, FHM. [facility] Pager [phone]  If  7PM-7AM, please contact night-coverage [email or web address] Password [email or web address] 05/27/2015, 1:48 PM    LOS: 3 days

## 2015-05-27 NOTE — Care Management Note (Addendum)
Case Management Note  Patient Details  Name: Kristen Contreras MRN: [id number] Date of Birth: 10/03/1951  Subjective/Objective: Pt admitted for pulmonary embolus and DVT. Pt uses [facility] in [place].                    Action/Plan: CM did fax insurance cards to admitting. Benefits check in process for lantus pens. CM will make pt aware once completed. Health Connect Form to be given to pt as well for PCP needs. No further needs at this time.    1448 05-27-15   Lantus [place] pay      Pt copay will be $38- prior auth not required     1110 05-29-15 Pt has xarelto co pay and 30 day free cards. CM did call [facility] in [place] and medication is available in a limited amount. Per pharmacy can order the rest. Pulmonary appointment placed on AVS and pt to call the Health Connect Number for PCP. No further needs from CM at this time.   Expected Discharge Date:                  Expected Discharge Plan:  Home/Self Care  In-House Referral:     Discharge planning Services  CM Consult  Post Acute Care Choice:    Choice offered to:     DME Arranged:    DME Agency:     HH Arranged:    HH Agency:     Status of Service:  Completed, signed off  Medicare Important Message Given:   No Date Medicare IM Given:    Medicare IM give by:    Date Additional Medicare IM Given:    Additional Medicare Important Message give by:     If discussed at Long Length of Stay Meetings, dates discussed:    Additional Comments:  Gala LewandowskyGraves-Bigelow, Captain Blucher Kaye, RN 05/27/2015, 11:57 AM

## 2015-05-27 NOTE — Progress Notes (Signed)
Utilization review complete. Keegen Heffern RN CCM Case Mgmt phone [phone] 

## 2015-05-28 LAB — BASIC METABOLIC PANEL
Anion gap: 9 (ref 5–15)
BUN: <5 mg/dL — ABNORMAL LOW (ref 6–20)
CALCIUM: 8.4 mg/dL — AB (ref 8.9–10.3)
CO2: 26 mmol/L (ref 22–32)
CREATININE: 0.75 mg/dL (ref 0.44–1.00)
Chloride: 103 mmol/L (ref 101–111)
GFR calc Af Amer: >60 mL/min (ref >60)
GLUCOSE: 195 mg/dL — AB (ref 65–99)
POTASSIUM: 3.5 mmol/L (ref 3.5–5.1)
SODIUM: 138 mmol/L (ref 135–145)

## 2015-05-28 LAB — CBC WITH DIFFERENTIAL/PLATELET
BASOS PCT: 0 % (ref 0–1)
Basophils Absolute: 0 10*3/uL (ref 0.0–0.1)
EOS ABS: 0.2 10*3/uL (ref 0.0–0.7)
EOS PCT: 3 % (ref 0–5)
HCT: 32.4 % — ABNORMAL LOW (ref 36.0–46.0)
Hemoglobin: 10.9 g/dL — ABNORMAL LOW (ref 12.0–15.0)
LYMPHS ABS: 1.8 10*3/uL (ref 0.7–4.0)
Lymphocytes Relative: 30 % (ref 12–46)
MCH: 30.6 pg (ref 26.0–34.0)
MCHC: 33.6 g/dL (ref 30.0–36.0)
MCV: 91 fL (ref 78.0–100.0)
Monocytes Absolute: 0.5 10*3/uL (ref 0.1–1.0)
Monocytes Relative: 8 % (ref 3–12)
NEUTROS PCT: 59 % (ref 43–77)
Neutro Abs: 3.6 10*3/uL (ref 1.7–7.7)
Platelets: 145 10*3/uL — ABNORMAL LOW (ref 150–400)
RBC: 3.56 MIL/uL — AB (ref 3.87–5.11)
RDW: 15.4 % (ref 11.5–15.5)
WBC: 6.1 10*3/uL (ref 4.0–10.5)

## 2015-05-28 LAB — HEPARIN LEVEL (UNFRACTIONATED)
Heparin Unfractionated: 0.16 IU/mL — ABNORMAL LOW (ref 0.30–0.70)
Heparin Unfractionated: 0.55 IU/mL (ref 0.30–0.70)

## 2015-05-28 LAB — GLUCOSE, CAPILLARY
GLUCOSE-CAPILLARY: 243 mg/dL — AB (ref 65–99)
Glucose-Capillary: 235 mg/dL — ABNORMAL HIGH (ref 65–99)
Glucose-Capillary: 175 mg/dL — ABNORMAL HIGH (ref 65–99)
Glucose-Capillary: 199 mg/dL — ABNORMAL HIGH (ref 65–99)

## 2015-05-28 MED ORDER — INSULIN GLARGINE 100 UNIT/ML ~~LOC~~ SOLN
7.0000 [IU] | Freq: Every day | SUBCUTANEOUS | Status: DC
Start: 2015-05-29 — End: 2015-05-29
  Administered 2015-05-29: 7 [IU] via SUBCUTANEOUS
  Filled 2015-05-28: qty 0.07

## 2015-05-28 MED ORDER — RIVAROXABAN 15 MG PO TABS
15.0000 mg | ORAL_TABLET | Freq: Two times a day (BID) | ORAL | Status: DC
  Administered 2015-05-29: 15 mg via ORAL
  Filled 2015-05-28: qty 1

## 2015-05-28 MED ORDER — RIVAROXABAN (XARELTO) EDUCATION KIT FOR DVT/PE PATIENTS
PACK | Freq: Once | Status: AC
  Administered 2015-05-29: 08:00:00
  Filled 2015-05-28: qty 1

## 2015-05-28 MED ORDER — HEPARIN BOLUS VIA INFUSION
2000.0000 [IU] | Freq: Once | INTRAVENOUS | Status: AC
  Administered 2015-05-28: 2000 [IU] via INTRAVENOUS
  Filled 2015-05-28: qty 2000

## 2015-05-28 NOTE — Progress Notes (Signed)
ANTICOAGULATION CONSULT NOTE - Follow Up Consult  Pharmacy Consult for heparin Indication: pulmonary embolus and DVT   Labs:  Recent Labs  05/26/15 0250  05/27/15 0325 05/28/15 0534 05/28/15 1845  HGB 11.4*  --  11.2* 10.9*  --   HCT 32.2*  --  32.5* 32.4*  --   PLT 113*  --  138* 145*  --   HEPARINUNFRC  --   < > 0.34 0.16* 0.55  CREATININE 0.81  --  0.74 0.75  --   TROPONINI  --   --  0.05*  --   --   < > = values in this interval not displayed.    Assessment: 64yo female continues on heparin for PE / DVT Heparin level therapeutic  To start Xarelto in the morning  Goal of Therapy:  Heparin level 0.3-0.7 units/ml   Plan:  Continue heparin at 1250 units / hr Start Xarelto 15 mg po BID in the AM for 3 weeks then 20 mg daily  Thank you. Okey RegalLisa Koran Seabrook, PharmD [phone]  05/28/2015,7:40 PM

## 2015-05-28 NOTE — Plan of Care (Signed)
Problem: Consults Goal: Diagnosis - Venous Thromboembolism (VTE) Choose a selection  Outcome: Completed/Met Date Met:  05/28/15 PE (Pulmonary Embolism) and DVT

## 2015-05-28 NOTE — Discharge Instructions (Signed)
Information on my medicine - XARELTO (rivaroxaban)  This medication education was reviewed with me or my healthcare representative as part of my discharge preparation.  The pharmacist that spoke with me during my hospital stay was:  Dennie Fettersgan, Ashly Yepez Donovan, [facility] Center[facility]PH  WHY WAS Carlena HurlXARELTO PRESCRIBED FOR YOU? Xarelto was prescribed to treat blood clots that may have been found in the veins of your legs (deep vein thrombosis) or in your lungs (pulmonary embolism) and to reduce the risk of them occurring again.  What do you need to know about Xarelto? The starting dose is one 15 mg tablet taken TWICE daily with food for the FIRST 21 DAYS then on (enter date)  06/19/15  the dose is changed to one 20 mg tablet taken ONCE A DAY with your evening meal.  DO NOT stop taking Xarelto without talking to the health care provider who prescribed the medication.  Refill your prescription for 20 mg tablets before you run out.  After discharge, you should have regular check-up appointments with your healthcare provider that is prescribing your Xarelto.  In the future your dose may need to be changed if your kidney function changes by a significant amount.  What do you do if you miss a dose? If you are taking Xarelto TWICE DAILY and you miss a dose, take it as soon as you remember. You may take two 15 mg tablets (total 30 mg) at the same time then resume your regularly scheduled 15 mg twice daily the next day.  If you are taking Xarelto ONCE DAILY and you miss a dose, take it as soon as you remember on the same day then continue your regularly scheduled once daily regimen the next day. Do not take two doses of Xarelto at the same time.   Important Safety Information Xarelto is a blood thinner medicine that can cause bleeding. You should call your healthcare provider right away if you experience any of the following: ? Bleeding from an injury or your nose that does not stop. ? Unusual colored urine (red or dark brown)  or unusual colored stools (red or black). ? Unusual bruising for unknown reasons. ? A serious fall or if you hit your head (even if there is no bleeding).  Some medicines may interact with Xarelto and might increase your risk of bleeding while on Xarelto. To help avoid this, consult your healthcare provider or pharmacist prior to using any new prescription or non-prescription medications, including herbals, vitamins, non-steroidal anti-inflammatory drugs (NSAIDs) and supplements.  This website has more information on Xarelto: [email or web address].

## 2015-05-28 NOTE — Progress Notes (Signed)
ANTICOAGULATION CONSULT NOTE - Follow Up Consult  Pharmacy Consult for heparin Indication: pulmonary embolus and DVT   Labs:  Recent Labs  05/25/15 1315  05/26/15 0250 05/26/15 0724 05/27/15 0325 05/28/15 0534  HGB  --   < > 11.4*  --  11.2* 10.9*  HCT  --   --  32.2*  --  32.5* 32.4*  PLT  --   --  113*  --  138* 145*  HEPARINUNFRC  --   < >  --  0.54 0.34 0.16*  CREATININE 0.79  --  0.81  --  0.74  --   TROPONINI 0.07*  --   --   --  0.05*  --   < > = values in this interval not displayed.    Assessment: 64yo female subtherapeutic on heparin despite rate increase yesterday to prevent low level after falling from 0.5 to 0.3.  Goal of Therapy:  Heparin level 0.3-0.7 units/ml   Plan:  Will bolus with heparin 2000 units and increase gtt by 3 units/kg/hr to 1250 units/hr and check level in 6hr.  Vernard GamblesVeronda Jurrell Royster, PharmD, BCPS  05/28/2015,7:13 AM

## 2015-05-28 NOTE — Progress Notes (Addendum)
PROGRESS NOTE    Kristen KailDebra Contreras ZOX:[id number] DOB: 06/05/1951 DOA: 05/24/2015 PCP: No PCP Per Patient  HPI/Brief narrative 64 year old female patient, no significant past medical history, [facility] with DKA after she presented for 1 week of not feeling well. Once she was stabilized, she was transferred to the floor for further management. She then developed several episodes of presyncope which resulted in a CTA chest being done that revealed large PE. [facility] for consideration of advanced therapy. She was placed on IV heparin drip. Post stabilization, patient transferred to floor under TRH service on 05/26/15.   Assessment/Plan:  Submassive PE/acute DVT of R posterior tibial vein  - PESI score -2 at admit-low risk but still with RV strain and lactic acidosis - Started on IV heparin GTT on 05/24/15 and pulmonology notes recommends 3-5 days IV Heparin followed by transition to NOAC- Xarelto at DC to be started in the morning. Discussed with Dr. Marchelle Gearingamaswamy on 6/6 who recommended 5 days of IV heparin given clot burden- day 4/5 today. Can be switched to PO Xarelto and DC'ed home on 05/29/2015. - Pulmonology & myself discussed with patient regarding pros and cons of Coumadin versus NOAC and patient prefers NOAC - Length of treatment: 6-24 months at full dose - Follow-up with Dr. Marchelle Gearingamaswamy post discharge 1 - No personal or family history of VTE. Was recently on a cruise trip to the [place] Unprovoked - Consider outpatient hematology consultation for further evaluation , patient has not had age appropriate screening in  26 years, recommended outpt mammogram and colonoscopy - Mildly elevated troponin likely secondary to PE and right heart strain. Trending down.  DKA in newly diagnosed diabetic - CBG is mildly uncontrolled and fluctuating on Lantus and SSI - DKA resolved - A1c 11.5 - We'll likely DC home on Lantus  increased to 7 units today,  continue NovoLog  pre-meal  Hypokalemia/hypomagnesemia/hypophosphatemia - Replaced  Anemia and thrombocytopenia - Unclear etiology. Follow CBCs - Stable  Mediastinal mass and lung nodule - Reported on outside CT but this image not available to us - Dr. Marchelle Gearingamaswamy will follow and address as outpatient  Lactic acidosis - Likely related to submassive PE - Treated with IV fluids and improved - Resolved     DVT prophylaxis: Currently on IV heparin anticoagulation Code Status: Full Family Communication: Discussed with spouse at bedside Disposition Plan: DC home possibly on 05/29/15   Consultants:  CCM-signed off 6/6  Procedures:  2 D Echo: Study Conclusions  - Left ventricle: The cavity size was normal. Wall thickness was normal. Systolic function was normal. The estimated ejection fraction was in the range of 60% to 65%. D-shaped septum consistent with right ventricular pressure/ volume overload. Wall motion was normal; there were no regional wall motion abnormalities. Doppler parameters are consistent with abnormal left ventricular relaxation (grade 1 diastolic dysfunction). - Right ventricle: The cavity size was moderately to severely dilated. Wall thickness was normal. Systolic function was severely reduced. Apical contraction relatively spared (McConnell&'s sign) - Right atrium: The atrium was mildly dilated. - Pulmonary arteries: Systolic pressure was moderately increased. PA peak pressure: 50 mm Hg (S).  Bilateral lower extremity venous Dopplers: Summary: Findings consistent with acute deep vein thrombosis involving the posterior tibial vein of the right lower extremity.  Antibiotics:  None   Subjective: Denies chest pain or dyspnea. Continues to feel better every day, has not seen a PCP or a physician in 26 years  Objective:  Filed Vitals:   05/27/15 2034 05/28/15 0000 05/28/15 0400 05/28/15 0830  BP: 132/80 136/72 133/68 125/72    Pulse: 98  86 94  Temp: 98.7 F (37.1 C) 99.3 F (37.4 C) 98.7 F (37.1 C) 98.3 F (36.8 C)  TempSrc: Oral Oral Oral Oral  Resp: Height:      Weight:   81.511 kg (179 lb 11.2 oz)   SpO2: 98% 98% 98% 93%    Intake/Output Summary (Last 24 hours) at 05/28/15 1033 Last data filed at 05/28/15 0830  Gross per 24 hour  Intake    700 ml  Output      0 ml  Net    700 ml   Filed Weights   05/26/15 0721 05/27/15 0400 05/28/15 0400  Weight: 83.734 kg (184 lb 9.6 oz) 84.55 kg (186 lb 6.4 oz) 81.511 kg (179 lb 11.2 oz)     Exam:  General exam: Pleasant middle-aged female lying comfortably supine in bed. Respiratory system: Clear. No increased work of breathing. Cardiovascular system: S1 & S2 heard, RRR. No JVD, murmurs, gallops, clicks . Trace bilateral leg edema. Telemetry: Sinus rhythm. Gastrointestinal system: Abdomen is nondistended, soft and nontender. Normal bowel sounds heard. Central nervous system: Alert and oriented. No focal neurological deficits. Extremities: Symmetric 5 x 5 power.   Data Reviewed: Basic Metabolic Panel:  Recent Labs Lab 05/24/15 2030 05/25/15 0541 05/25/15 1315 05/26/15 0250 05/27/15 0325 05/28/15 0534  NA 137 140 140 142 141 138  K 2.7* 2.9* 4.6 3.5 4.3 3.5  CL 108 111 110 113* 106 103  CO2 22 20* GLUCOSE 189* 100* 214* 124* 145* 195*  BUN <5* 5* <5*  CREATININE 0.94 0.76 0.79 0.81 0.74 0.75  CALCIUM 8.3* 8.1* 8.6* 8.1* 8.4* 8.4*  MG 1.8  --  2.3 1.8 1.8  --   PHOS 1.2*  --  2.6 2.7 3.8  --    Liver Function Tests:  Recent Labs Lab 05/24/15 2030  AST 19  ALT 14  ALKPHOS 91  BILITOT 0.5  PROT 5.1*  ALBUMIN 2.7*   No results for input(s): LIPASE, AMYLASE in the last 168 hours. No results for input(s): AMMONIA in the last 168 hours. CBC:  Recent Labs Lab 05/24/15 2030 05/25/15 0541 05/26/15 0250 05/27/15 0325 05/28/15 0534  WBC 10.7* 9.6 7.2 6.3 6.1  NEUTROABS  --   --  3.8 3.4 3.6  HGB  13.0 12.2 11.4* 11.2* 10.9*  HCT 36.0 33.4* 32.2* 32.5* 32.4*  MCV 84.9 85.0 89.0 90.0 91.0  PLT 122* 102* 113* 138* 145*   Cardiac Enzymes:  Recent Labs Lab 05/25/15 1315 05/27/15 0325  TROPONINI 0.07* 0.05*   BNP (last 3 results) No results for input(s): PROBNP in the last 8760 hours. CBG:  Recent Labs Lab 05/27/15 0746 05/27/15 1125 05/27/15 1620 05/27/15 2059 05/28/15 0946  GLUCAP 149* 197* 194* 192* 243*    Recent Results (from the past 240 hour(s))  MRSA PCR Screening     Status: Abnormal   Collection Time: 05/24/15  8:13 PM  Result Value Ref Range Status   MRSA by PCR POSITIVE (A) NEGATIVE Final    Comment:        The GeneXpert MRSA Assay (FDA approved for NASAL specimens only), is one component of a comprehensive MRSA colonization surveillance program. It is not intended to diagnose MRSA infection nor to guide or monitor treatment for MRSA infections. RESULT CALLED  TO, READ BACK BY AND VERIFIED WITHErby Pian RN 2225 05/24/15 A BROWNING          Studies: No results found.      Scheduled Meds: . Chlorhexidine Gluconate Cloth  6 each Topical Q0600  . insulin aspart  0-5 Units Subcutaneous QHS  . insulin aspart  0-9 Units Subcutaneous TID WC  . insulin glargine  5 Units Subcutaneous Daily  . living well with diabetes book   Does not apply Once  . mupirocin ointment  1 application Nasal BID  . pantoprazole  40 mg Oral Daily   Continuous Infusions: . heparin 1,250 Units/hr (05/28/15 0736)    Active Problems:   Pulmonary embolism   Mediastinal mass   DKA (diabetic ketoacidosis)   Hypophosphatemia   Hypomagnesemia   Absolute anemia   Thrombocytopenia   DM (diabetes mellitus), type 2, uncontrolled    Time spent: 30 minutes.    Richarda Overlie, MD,   [facility] Pager [phone]  If 7PM-7AM, please contact night-coverage [email or web address] Password [email or web address] 05/28/2015, 10:33 AM    LOS: 4 days

## 2015-05-28 NOTE — Progress Notes (Signed)
Inpatient Diabetes Program Recommendations  AACE/ADA: New Consensus Statement on Inpatient Glycemic Control (2013)  Target Ranges:  Prepandial:   less than 140 mg/dL      Peak postprandial:   less than 180 mg/dL (1-2 hours)      Critically ill patients:  140 - 180 mg/dL   Results for ANYELINA, CLAYCOMB (MRN [id number]) as of 05/28/2015 09:18  Ref. Range 05/27/2015 07:46 05/27/2015 11:25 05/27/2015 16:20 05/27/2015 20:59  Glucose-Capillary Latest Ref Range: 65-99 mg/dL 149 (H) 197 (H) 194 (H) 192 (H)    Inpatient Diabetes Program Recommendations Insulin - Basal: Please consider increasing Lantus to 7 units Daily.  Note: Pt will need glucose meter kit at d/c (order # [id number]), insulin pen needles (order # [id number]), and Lantus Solostar.  Thanks,  Tama Headings RN, MSN, [facility] Inpatient Diabetes Coordinator Team Pager [phone]

## 2015-05-29 LAB — CBC WITH DIFFERENTIAL/PLATELET
Basophils Absolute: 0 10*3/uL (ref 0.0–0.1)
Basophils Relative: 0 % (ref 0–1)
EOS ABS: 0.2 10*3/uL (ref 0.0–0.7)
Eosinophils Relative: 4 % (ref 0–5)
HEMATOCRIT: 32.6 % — AB (ref 36.0–46.0)
HEMOGLOBIN: 10.9 g/dL — AB (ref 12.0–15.0)
LYMPHS PCT: 37 % (ref 12–46)
Lymphs Abs: 2.2 10*3/uL (ref 0.7–4.0)
MCH: 30.7 pg (ref 26.0–34.0)
MCHC: 33.4 g/dL (ref 30.0–36.0)
MCV: 91.8 fL (ref 78.0–100.0)
MONOS PCT: 9 % (ref 3–12)
Monocytes Absolute: 0.5 10*3/uL (ref 0.1–1.0)
Neutro Abs: 3 10*3/uL (ref 1.7–7.7)
Neutrophils Relative %: 50 % (ref 43–77)
Platelets: 149 10*3/uL — ABNORMAL LOW (ref 150–400)
RBC: 3.55 MIL/uL — ABNORMAL LOW (ref 3.87–5.11)
RDW: 15.2 % (ref 11.5–15.5)
WBC: 5.9 10*3/uL (ref 4.0–10.5)

## 2015-05-29 LAB — GLUCOSE, CAPILLARY
GLUCOSE-CAPILLARY: 256 mg/dL — AB (ref 65–99)
Glucose-Capillary: 214 mg/dL — ABNORMAL HIGH (ref 65–99)

## 2015-05-29 LAB — HEPARIN LEVEL (UNFRACTIONATED): HEPARIN UNFRACTIONATED: 0.46 [IU]/mL (ref 0.30–0.70)

## 2015-05-29 MED ORDER — FREESTYLE SYSTEM KIT: 1.0000 | PACK | Status: AC | PRN

## 2015-05-29 MED ORDER — PANTOPRAZOLE SODIUM 40 MG PO TBEC: 40.0000 mg | DELAYED_RELEASE_TABLET | Freq: Every day | ORAL | Status: AC

## 2015-05-29 MED ORDER — RIVAROXABAN 15 MG PO TABS: 15.0000 mg | ORAL_TABLET | Freq: Two times a day (BID) | ORAL | Status: DC

## 2015-05-29 MED ORDER — RIVAROXABAN 20 MG PO TABS: 20.0000 mg | ORAL_TABLET | Freq: Every day | ORAL | Status: DC

## 2015-05-29 MED ORDER — INSULIN DETEMIR 100 UNIT/ML FLEXPEN: 8.0000 [IU] | PEN_INJECTOR | Freq: Every day | SUBCUTANEOUS | Status: DC

## 2015-05-29 MED ORDER — "PEN NEEDLES 3/16"" 31G X 5 MM MISC": Status: AC

## 2015-05-29 NOTE — Progress Notes (Signed)
SATURATION QUALIFICATIONS: (This note is used to comply with regulatory documentation for home oxygen)  Patient Saturations on Room Air at Rest = 100%  Patient Saturations on Room Air while Ambulating = 96%  Patient Saturations on 0 Liters of oxygen while Ambulating = 93-96%  Please briefly explain why patient needs home oxygen: Does NOT need it.

## 2015-05-29 NOTE — Discharge Summary (Signed)
Physician Discharge Summary  Marleta Lapierre MRN: [id number] DOB/AGE: Jan 15, 1951 64 y.o.  PCP: No PCP Per Patient   Admit date: 05/24/2015 Discharge date: 05/29/2015  Discharge Diagnoses:     Active Problems:   Pulmonary embolism   Mediastinal mass   DKA (diabetic ketoacidosis)   Hypophosphatemia   Hypomagnesemia   Absolute anemia   Thrombocytopenia   DM (diabetes mellitus), type 2, uncontrolled  Follow-up recommendations Follow-up with PCP in 3-5 days Follow-up CBC, CMP in 3-5 days    Medication List    TAKE these medications        Insulin Detemir 100 UNIT/ML Pen  Commonly known as:  LEVEMIR FLEXPEN  Inject 8 Units into the skin daily at 10 pm.     omeprazole 20 MG capsule  Commonly known as:  PRILOSEC  Take 20 mg by mouth daily.     pantoprazole 40 MG tablet  Commonly known as:  PROTONIX  Take 1 tablet (40 mg total) by mouth daily.     Rivaroxaban 15 MG Tabs tablet  Commonly known as:  XARELTO  Take 1 tablet (15 mg total) by mouth 2 (two) times daily with a meal.     rivaroxaban 20 MG Tabs tablet  Commonly known as:  XARELTO  Take 1 tablet (20 mg total) by mouth daily with supper.  Start taking on:  06/20/2015         Discharge Condition: Stable    Disposition: Final discharge disposition not confirmed   Consults: Pulmonary    Significant Diagnostic Studies:  Dg Chest Port 1 View  05/25/2015   CLINICAL DATA:  Pulmonary embolism.  EXAM: PORTABLE CHEST - 1 VIEW  COMPARISON:  None.  FINDINGS: Normal mediastinum and cardiac silhouette. Normal pulmonary vasculature. No evidence of effusion, infiltrate, or pneumothorax. No acute bony abnormality.  IMPRESSION: No acute cardiopulmonary process.   Electronically Signed   By: Suzy Bouchard M.D.   On: 05/25/2015 15:12    2-D echo LV EF: 60% -  65%  ------------------------------------------------------------------- Indications:   Pulmonary embolus  415.19.  ------------------------------------------------------------------- History:  Risk factors: Mediastinal mass.  ------------------------------------------------------------------- Study Conclusions  - Left ventricle: The cavity size was normal. Wall thickness was normal. Systolic function was normal. The estimated ejection fraction was in the range of 60% to 65%. D-shaped septum consistent with right ventricular pressure/ volume overload. Wall motion was normal; there were no regional wall motion abnormalities. Doppler parameters are consistent with abnormal left ventricular relaxation (grade 1 diastolic dysfunction). - Right ventricle: The cavity size was moderately to severely dilated. Wall thickness was normal. Systolic function was severely reduced. Apical contraction relatively spared (McConnell&'s sign) - Right atrium: The atrium was mildly dilated. - Pulmonary arteries: Systolic pressure was moderately increased. PA peak pressure: 50 mm Hg (S).    Filed Weights   05/27/15 0400 05/28/15 0400 05/29/15 0458  Weight: 84.55 kg (186 lb 6.4 oz) 81.511 kg (179 lb 11.2 oz) 78.926 kg (174 lb)     Microbiology: Recent Results (from the past 240 hour(s))  MRSA PCR Screening     Status: Abnormal   Collection Time: 05/24/15  8:13 PM  Result Value Ref Range Status   MRSA by PCR POSITIVE (A) NEGATIVE Final    Comment:        The GeneXpert MRSA Assay (FDA approved for NASAL specimens only), is one component of a comprehensive MRSA colonization surveillance program. It is not intended to diagnose MRSA infection nor to guide or monitor treatment for MRSA infections.  RESULT CALLED TO, READ BACK BY AND VERIFIED WITHCipriano Mile RN [id number] 05/24/15 A BROWNING        Blood Culture No results found for: SDES, [place], CULT, REPTSTATUS    Labs: Results for orders placed or performed during the hospital encounter of 05/24/15 (from the past 48  hour(s))  Glucose, capillary     Status: Abnormal   Collection Time: 05/27/15 11:25 AM  Result Value Ref Range   Glucose-Capillary 197 (H) 65 - 99 mg/dL   Comment 1 Notify RN    Comment 2 Document in Chart   Glucose, capillary     Status: Abnormal   Collection Time: 05/27/15  4:20 PM  Result Value Ref Range   Glucose-Capillary 194 (H) 65 - 99 mg/dL   Comment 1 Notify RN    Comment 2 Document in Chart   Glucose, capillary     Status: Abnormal   Collection Time: 05/27/15  8:59 PM  Result Value Ref Range   Glucose-Capillary 192 (H) 65 - 99 mg/dL  CBC with Differential/Platelet     Status: Abnormal   Collection Time: 05/28/15  5:34 AM  Result Value Ref Range   WBC 6.1 4.0 - 10.5 K/uL   RBC 3.56 (L) 3.87 - 5.11 MIL/uL   Hemoglobin 10.9 (L) 12.0 - 15.0 g/dL   HCT 32.4 (L) 36.0 - 46.0 %   MCV 91.0 78.0 - 100.0 fL   MCH 30.6 26.0 - 34.0 pg   MCHC 33.6 30.0 - 36.0 g/dL   RDW 15.4 11.5 - 15.5 %   Platelets 145 (L) 150 - 400 K/uL   Neutrophils Relative % 59 43 - 77 %   Neutro Abs 3.6 1.7 - 7.7 K/uL   Lymphocytes Relative 30 12 - 46 %   Lymphs Abs 1.8 0.7 - 4.0 K/uL   Monocytes Relative 8 3 - 12 %   Monocytes Absolute 0.5 0.1 - 1.0 K/uL   Eosinophils Relative 3 0 - 5 %   Eosinophils Absolute 0.2 0.0 - 0.7 K/uL   Basophils Relative 0 0 - 1 %   Basophils Absolute 0.0 0.0 - 0.1 K/uL  Basic metabolic panel     Status: Abnormal   Collection Time: 05/28/15  5:34 AM  Result Value Ref Range   Sodium 138 135 - 145 mmol/L   Potassium 3.5 3.5 - 5.1 mmol/L   Chloride 103 101 - 111 mmol/L   CO2 26 22 - 32 mmol/L   Glucose, Bld 195 (H) 65 - 99 mg/dL   BUN <5 (L) 6 - 20 mg/dL   Creatinine, Ser 0.75 0.44 - 1.00 mg/dL   Calcium 8.4 (L) 8.9 - 10.3 mg/dL   GFR calc non Af Amer >60 >60 mL/min   GFR calc Af Amer >60 >60 mL/min    Comment: (NOTE) The eGFR has been calculated using the CKD EPI equation. This calculation has not been validated in all clinical situations. eGFR's persistently <60  mL/min signify possible Chronic Kidney Disease.    Anion gap 9 5 - 15  Heparin level (unfractionated)     Status: Abnormal   Collection Time: 05/28/15  5:34 AM  Result Value Ref Range   Heparin Unfractionated 0.16 (L) 0.30 - 0.70 IU/mL    Comment:        IF HEPARIN RESULTS ARE BELOW EXPECTED VALUES, AND PATIENT DOSAGE HAS BEEN CONFIRMED, SUGGEST FOLLOW UP TESTING OF ANTITHROMBIN III LEVELS.   Glucose, capillary     Status: Abnormal   Collection  Time: 05/28/15  9:46 AM  Result Value Ref Range   Glucose-Capillary 243 (H) 65 - 99 mg/dL  Glucose, capillary     Status: Abnormal   Collection Time: 05/28/15 11:25 AM  Result Value Ref Range   Glucose-Capillary 235 (H) 65 - 99 mg/dL   Comment 1 Notify RN    Comment 2 Document in Chart   Glucose, capillary     Status: Abnormal   Collection Time: 05/28/15  5:01 PM  Result Value Ref Range   Glucose-Capillary 175 (H) 65 - 99 mg/dL   Comment 1 Notify RN    Comment 2 Document in Chart   Heparin level (unfractionated)     Status: None   Collection Time: 05/28/15  6:45 PM  Result Value Ref Range   Heparin Unfractionated 0.55 0.30 - 0.70 IU/mL    Comment:        IF HEPARIN RESULTS ARE BELOW EXPECTED VALUES, AND PATIENT DOSAGE HAS BEEN CONFIRMED, SUGGEST FOLLOW UP TESTING OF ANTITHROMBIN III LEVELS.   Glucose, capillary     Status: Abnormal   Collection Time: 05/28/15  8:58 PM  Result Value Ref Range   Glucose-Capillary 199 (H) 65 - 99 mg/dL  CBC with Differential/Platelet     Status: Abnormal   Collection Time: 05/29/15  4:19 AM  Result Value Ref Range   WBC 5.9 4.0 - 10.5 K/uL   RBC 3.55 (L) 3.87 - 5.11 MIL/uL   Hemoglobin 10.9 (L) 12.0 - 15.0 g/dL   HCT 32.6 (L) 36.0 - 46.0 %   MCV 91.8 78.0 - 100.0 fL   MCH 30.7 26.0 - 34.0 pg   MCHC 33.4 30.0 - 36.0 g/dL   RDW 15.2 11.5 - 15.5 %   Platelets 149 (L) 150 - 400 K/uL   Neutrophils Relative % 50 43 - 77 %   Neutro Abs 3.0 1.7 - 7.7 K/uL   Lymphocytes Relative 37 12 - 46 %    Lymphs Abs 2.2 0.7 - 4.0 K/uL   Monocytes Relative 9 3 - 12 %   Monocytes Absolute 0.5 0.1 - 1.0 K/uL   Eosinophils Relative 4 0 - 5 %   Eosinophils Absolute 0.2 0.0 - 0.7 K/uL   Basophils Relative 0 0 - 1 %   Basophils Absolute 0.0 0.0 - 0.1 K/uL  Heparin level (unfractionated)     Status: None   Collection Time: 05/29/15  4:19 AM  Result Value Ref Range   Heparin Unfractionated 0.46 0.30 - 0.70 IU/mL    Comment:        IF HEPARIN RESULTS ARE BELOW EXPECTED VALUES, AND PATIENT DOSAGE HAS BEEN CONFIRMED, SUGGEST FOLLOW UP TESTING OF ANTITHROMBIN III LEVELS.   Glucose, capillary     Status: Abnormal   Collection Time: 05/29/15  7:45 AM  Result Value Ref Range   Glucose-Capillary 214 (H) 65 - 99 mg/dL   Comment 1 Notify RN    Comment 2 Document in Chart      Lipid Panel  No results found for: CHOL, TRIG, HDL, CHOLHDL, VLDL, LDLCALC, LDLDIRECT   Lab Results  Component Value Date   HGBA1C 11.5* 05/24/2015     Lab Results  Component Value Date   CREATININE 0.75 05/28/2015     HPI/Brief narrative 64 year old female patient, no significant past medical history, admitted to [facility] on 6/1 with DKA after she presented for 1 week of not feeling well. Once she was stabilized, she was transferred to the floor for further management.  She then developed several episodes of presyncope which resulted in a CTA chest being done that revealed large PE. She was transferred to [facility] ICU for consideration of advanced therapy. She was placed on IV heparin drip. Post stabilization, patient transferred to floor under [place] service on 05/26/15.   Assessment/Plan:  Submassive PE/acute DVT of R posterior tibial vein  - PESI score -2 at admit-low risk but still with RV strain and lactic acidosis - Started on IV heparin GTT on 05/24/15 and pulmonology notes recommends 3-5 days IV Heparin followed by transition to NOAC- started the patient on Xarelto 6/9.  Discussed with Dr. Chase Caller  on 6/6 who recommended 5 days of IV heparin given clot burden- day 4/5 today. Can be switched to PO Xarelto and DC'ed home on 05/29/2015. - Pulmonology & myself discussed with patient regarding pros and cons of Coumadin versus NOAC and patient prefers NOAC - Length of treatment: 6-24 months at full dose - Follow-up with Dr. Chase Caller post discharge 1 - No personal or family history of VTE. Was recently on a cruise trip to the [place].? Unprovoked - Consider outpatient hematology consultation for further evaluation , patient has not had age appropriate screening in 26 years, recommended outpt mammogram and colonoscopy - Mildly elevated troponin likely secondary to PE and right heart strain. Trending down.  DKA in newly diagnosed diabetic - CBG is mildly uncontrolled and fluctuating on Lantus and SSI - DKA resolved - A1c 11.5 - Patient is being discharged home on Levemir flexPEN and glucometer Extensive insulin teaching done during this hospitalization  Hypokalemia/hypomagnesemia/hypophosphatemia - Replaced  Anemia and thrombocytopenia - Unclear etiology. Follow CBCs - Stable  Mediastinal mass and lung nodule - Reported on outside CT but this image not available to [place] - Dr. Chase Caller will follow and address as outpatient  Lactic acidosis - Likely related to submassive PE - Treated with IV fluids and improved - Resolved      Discharge Exam:    Blood pressure 133/75, pulse 76, temperature 98.3 F (36.8 C), temperature source Oral, resp. rate 16, height _0  (1.626 m), weight 78.926 kg (174 lb), SpO2 96 %.  General exam: Pleasant middle-aged female lying comfortably supine in bed. Respiratory system: Clear. No increased work of breathing. Cardiovascular system: S1 & S2 heard, RRR. No JVD, murmurs, gallops, clicks . Trace bilateral leg edema. Telemetry: Sinus rhythm. Gastrointestinal system: Abdomen is nondistended, soft and nontender. Normal bowel sounds heard. Central  nervous system: Alert and oriented. No focal neurological deficits. Extremities: Symmetric 5 x 5 power.       Discharge Instructions    Diet - low sodium heart healthy    Complete by:  As directed      Increase activity slowly    Complete by:  As directed            Follow-up Information    Follow up with [facility], MD On 06/10/2015.   Specialty:  Pulmonary Disease   Why:  at 4pm   Contact information:   [place]  [address] [phone]       Please follow up.   Why:  Call [place] [phone] or [phone] to arrange Primary Care Physician for followup in 1 week post hospital followup      Signed: [facility] 05/29/2015, 10:27 AM        Time spent >45 mins

## 2015-06-02 ENCOUNTER — Telehealth: Payer: Self-pay | Admitting: Family

## 2015-06-02 NOTE — Telephone Encounter (Signed)
Called patient back.  Gave message.  Patient states she went to an urgent care and got her numbers worked out.  She states she did not want to establish care with NP.  I offered to establish her with Genella Mech at her next available appointment.  Patient would like to get in sooner than that.  I offered to giver her some of our other locations to see if she could get in with an MD sooner.  Patient stated she would call back to get those numbers.

## 2015-06-02 NOTE — Telephone Encounter (Signed)
Patient was released from [facility] ER on Thursday.  Patient does not have a primary care provider and is needing to establish care as well as a ER follow up.  Patient states her numbers have not been good over the weekend.  I did advise patient to get back in touch with the hospital.  Patient is requesting to be worked in soon to establish care.  Please advise.

## 2015-06-02 NOTE — Telephone Encounter (Signed)
That's fine. How about Wednesday afternoon.

## 2015-06-02 NOTE — Telephone Encounter (Signed)
Patient stated that she has already established care with someone else.

## 2015-06-02 NOTE — Telephone Encounter (Signed)
Noted  

## 2015-06-10 ENCOUNTER — Ambulatory Visit (INDEPENDENT_AMBULATORY_CARE_PROVIDER_SITE_OTHER): Payer: BLUE CROSS/BLUE SHIELD | Admitting: Adult Health

## 2015-06-10 ENCOUNTER — Encounter: Payer: Self-pay | Admitting: Adult Health

## 2015-06-10 VITALS — BP 118/80 | HR 96 | Temp 97.9°F | Ht 63.5 in | Wt 163.0 lb

## 2015-06-10 DIAGNOSIS — I2699 Other pulmonary embolism without acute cor pulmonale: Secondary | ICD-10-CM

## 2015-06-10 DIAGNOSIS — E1165 Type 2 diabetes mellitus with hyperglycemia: Secondary | ICD-10-CM | POA: Diagnosis not present

## 2015-06-10 DIAGNOSIS — IMO0002 Reserved for concepts with insufficient information to code with codable children: Secondary | ICD-10-CM

## 2015-06-10 DIAGNOSIS — R911 Solitary pulmonary nodule: Secondary | ICD-10-CM | POA: Insufficient documentation

## 2015-06-10 DIAGNOSIS — R222 Localized swelling, mass and lump, trunk: Secondary | ICD-10-CM | POA: Diagnosis not present

## 2015-06-10 DIAGNOSIS — J9859 Other diseases of mediastinum, not elsewhere classified: Secondary | ICD-10-CM

## 2015-06-10 DIAGNOSIS — J209 Acute bronchitis, unspecified: Secondary | ICD-10-CM | POA: Insufficient documentation

## 2015-06-10 NOTE — Progress Notes (Signed)
   Subjective:    Patient ID: Kristen Contreras, female    DOB: [birth year], 64 y.o.   MRN: [id number]  HPI 64 year old female seen for initial pulmonary consult during hospitalization for pulmonary embolism on 05/24/2015  06/10/2015 post hospital follow-up Patient presents for post hospital follow-up Patient was recently admitted to [facility] on June 1 for DKA. Patient had an episode of presyncope during her hospitalization and underwent a CT angiogram that revealed a large pulmonary embolism. Right DVT .  She was transferred to [facility] on June 4 CT showed a large saddle pulmonary embolism   0.7 cm left lower lung nodule, 2.4 cm nodular soft tissue within the mediastinum. Patient was started on IV heparin and transitioned to Xarelto. She was discharged on a starter pack with planned transition to Xarelto 20 mg daily on day 21. Which is July 1. Patient has refills on her current prescription. Patient has no previous history of blood clot and there is no family history. She had recently been on a cruise to the [place]. She has not had any previous outpatient screening exams such as mammogram or colonoscopy. Since discharge. Patient is feeling New pt here for HFU.  Was seen at [facility] in [place], for bronchitis for prod coughing with yellow mucus, wheezing and SOB.. Starting to feel better. She denies any hemoptysis, chest pain, orthopnea, PND or leg swelling. Husband has similar symtpoms.   Had new dx DM with DKA has been set up with PCP.   Patient is a retired Runner, broadcasting/film/video. Says that she has been healthy and does not have regular medical care. She denies any previous hormone therapy.    Review of Systems Constitutional:   No  weight loss, night sweats,  Fevers, chills, fatigue, or  lassitude.  HEENT:   No headaches,  Difficulty swallowing,  Tooth/dental problems, or  Sore throat,                No sneezing, itching, ear ache, nasal congestion, post nasal drip,   CV:  No  chest pain,  Orthopnea, PND, swelling in lower extremities, anasarca, dizziness, palpitations, syncope.   GI  No heartburn, indigestion, abdominal pain, nausea, vomiting, diarrhea, change in bowel habits, loss of appetite, bloody stools.   Resp: No chest wall deformity  Skin: no rash or lesions.  GU: no dysuria, change in color of urine, no urgency or frequency.  No flank pain, no hematuria   MS:  No joint pain or swelling.  No decreased range of motion.  No back pain.  Psych:  No change in mood or affect. No depression or anxiety.  No memory loss.         Objective:   Physical Exam GEN: A/Ox3; pleasant , NAD, well nourished   HEENT:  [place]/AT,  EACs-clear, TMs-wnl, NOSE-clear, THROAT-clear, no lesions, no postnasal drip or exudate noted.   NECK:  Supple w/ fair ROM; no JVD; normal carotid impulses w/o bruits; no thyromegaly or nodules palpated; no lymphadenopathy.  RESP  Few trace rhonchi.no accessory muscle use, no dullness to percussion  CARD:  RRR, no m/r/g  , no peripheral edema, pulses intact, no cyanosis or clubbing.  GI:   Soft & nt; nml bowel sounds; no organomegaly or masses detected.  Musco: Warm bil, no deformities or joint swelling noted.   Neuro: alert, no focal deficits noted.    Skin: Warm, no lesions or rashes         Assessment & Plan:

## 2015-06-10 NOTE — Assessment & Plan Note (Signed)
Newly diagnosed diabetes , DKA  She is now improving on insulin He is to follow with her primary care physician as planned

## 2015-06-10 NOTE — Assessment & Plan Note (Signed)
0.7 cm left lower lobe lung nodule noted on CT chest Will need a follow-up CT in 3 months

## 2015-06-10 NOTE — Assessment & Plan Note (Addendum)
Recently diagnosed bronchitis Patient is improving with a Z-Pak  adding Mucinex as needed

## 2015-06-10 NOTE — Assessment & Plan Note (Signed)
Large saddle pulmonary embolism with a right DVT , questionable unprovoked. Patient did have extended travel, but was 2 months prior to diagnosis There is some concern as she has a CT that shows a lung nodule and questionable mediastinal mass has not had any routine medical care with screening such as mammogram and colonoscopy  She will continue on Xarelto 15 mg twice daily with transition to 20 mg daily . On July 1. Patient did have evidence of right sided heart strain. She will need a repeat echo at 3 months. We'll discuss with Dr. Marchelle Gearing, on return on Link of therapy and consideration for hematology referral in the future if needed

## 2015-06-10 NOTE — Patient Instructions (Signed)
Continue on Xarelto 15mg  Twice daily  Until 06/20/15 then 20mg  daily  We will need repeat echo and CT chest , will discuss at next office visit.  Follow up with Primary care doctor as planned Finish Zpack .  Mucinex DM Twice daily  As needed  Cough /congestion  Follow up with Dr. Marchelle Gearing in 6 weeks and As needed   Please contact office for sooner follow up if symptoms do not improve or worsen or seek emergency care

## 2015-06-10 NOTE — Assessment & Plan Note (Addendum)
CT chest showed a possible 2.4 cm nodular soft tissue along the mediastinal but will need further follow-up.  She will need a CT chest that up in 3 months.

## 2015-07-03 ENCOUNTER — Other Ambulatory Visit: Payer: Self-pay | Admitting: Internal Medicine

## 2015-07-03 ENCOUNTER — Other Ambulatory Visit (HOSPITAL_COMMUNITY)
Admission: RE | Admit: 2015-07-03 | Discharge: 2015-07-03 | Disposition: A | Discharge status: Home / Self Care | Payer: BLUE CROSS/BLUE SHIELD | Source: Ambulatory Visit | Attending: Internal Medicine | Admitting: Internal Medicine

## 2015-07-03 DIAGNOSIS — Z01419 Encounter for gynecological examination (general) (routine) without abnormal findings: Secondary | ICD-10-CM | POA: Diagnosis present

## 2015-07-03 DIAGNOSIS — Z1151 Encounter for screening for human papillomavirus (HPV): Secondary | ICD-10-CM | POA: Diagnosis present

## 2015-07-08 LAB — CYTOLOGY - PAP

## 2015-07-15 ENCOUNTER — Ambulatory Visit (INDEPENDENT_AMBULATORY_CARE_PROVIDER_SITE_OTHER): Payer: BLUE CROSS/BLUE SHIELD | Admitting: Internal Medicine

## 2015-07-15 ENCOUNTER — Encounter: Payer: Self-pay | Admitting: Internal Medicine

## 2015-07-15 VITALS — BP 124/80 | HR 90 | Ht 63.5 in | Wt 158.2 lb

## 2015-07-15 DIAGNOSIS — R911 Solitary pulmonary nodule: Secondary | ICD-10-CM | POA: Diagnosis not present

## 2015-07-15 DIAGNOSIS — E1165 Type 2 diabetes mellitus with hyperglycemia: Secondary | ICD-10-CM

## 2015-07-15 DIAGNOSIS — J9859 Other diseases of mediastinum, not elsewhere classified: Secondary | ICD-10-CM

## 2015-07-15 DIAGNOSIS — R222 Localized swelling, mass and lump, trunk: Secondary | ICD-10-CM

## 2015-07-15 DIAGNOSIS — I2699 Other pulmonary embolism without acute cor pulmonale: Secondary | ICD-10-CM | POA: Diagnosis not present

## 2015-07-15 DIAGNOSIS — IMO0002 Reserved for concepts with insufficient information to code with codable children: Secondary | ICD-10-CM

## 2015-07-15 NOTE — Progress Notes (Signed)
Subjective:    Patient ID: Kristen Contreras, female    DOB: 1951/04/15, 64 y.o.   MRN: [id number]  HPI  Submassive PE but with PESI score -2 at admit - Low RISK but still with RV strain and lactic acidosis 05/24/15  - Rx Heparin/Xarelto  Mediastinal Mass and Lung nodule 7mm LLL -- incidental dx 05/24/15 - CT [place] seen with DR Mosetta Putt of rads: 2cm annterior mediastinal mass + 7mm lung noudle - needs fu CT chest 3 months per rad   OV 07/15/2015  Chief Complaint  Patient presents with  . Follow-up    Pt saw TP for HFU for PE. Pt states is doing well. Pt states she feels back to her baseline. Pt denies SOB, cough, and CP/tightness.    Follow-up for the above 2 problems  - She presents with her husband who is also giving hx this visit. Both report that she is doing well. After discharge from the hospital in June 2016 she did have bronchitis and according to her husband later he got this. Both were treated with Z-Pak and are doing well. There is no dyspnea or chest pain or hemoptysis no syncope. She is doing really well. There is no bleeding risk. She's taking precautions to avoid any trauma. She continues on Xarelto. She has questions about her mediastinal mass. She wanted to see the on the CT scan and I personally visualized the film and demonstrated it to them both to me to several mass and the left lower lobe 7 mm lung nodule     Review of Systems  Constitutional: Negative for fever and unexpected weight change.  HENT: Negative for congestion, dental problem, ear pain, nosebleeds, postnasal drip, rhinorrhea, sinus pressure, sneezing, sore throat and trouble swallowing.   Eyes: Negative for redness and itching.  Respiratory: Negative for cough, chest tightness, shortness of breath and wheezing.   Cardiovascular: Negative for palpitations and leg swelling.  Gastrointestinal: Negative for nausea and vomiting.  Genitourinary: Negative for dysuria.    Musculoskeletal: Negative for joint swelling.  Skin: Negative for rash.  Neurological: Negative for headaches.  Hematological: Does not bruise/bleed easily.  Psychiatric/Behavioral: Negative for dysphoric mood. The patient is not nervous/anxious.        Objective:   Physical Exam  Constitutional: She is oriented to person, place, and time. She appears well-developed and well-nourished. No distress.  HENT:  Head: Normocephalic and atraumatic.  Right Ear: External ear normal.  Left Ear: External ear normal.  Mouth/Throat: Oropharynx is clear and moist. No oropharyngeal exudate.  Eyes: Conjunctivae and EOM are normal. Pupils are equal, round, and reactive to light. Right eye exhibits no discharge. Left eye exhibits no discharge. No scleral icterus.  Neck: Normal range of motion. Neck supple. No JVD present. No tracheal deviation present. No thyromegaly present.  Cardiovascular: Normal rate, regular rhythm, normal heart sounds and intact distal pulses.  Exam reveals no gallop and no friction rub.   No murmur heard. Pulmonary/Chest: Effort normal and breath sounds normal. No respiratory distress. She has no wheezes. She has no rales. She exhibits no tenderness.  Abdominal: Soft. Bowel sounds are normal. She exhibits no distension and no mass. There is no tenderness. There is no rebound and no guarding.  Musculoskeletal: Normal range of motion. She exhibits no edema or tenderness.  Lymphadenopathy:    She has no cervical adenopathy.  Neurological: She is alert and oriented to person, place, and time. She has normal reflexes. No cranial nerve deficit. She exhibits  normal muscle tone. Coordination normal.  Skin: Skin is warm and dry. No rash noted. She is not diaphoretic. No erythema. No pallor.  Psychiatric: She has a normal mood and affect. Her behavior is normal. Judgment and thought content normal.  Vitals reviewed.   Filed Vitals:   07/15/15 1125  BP: 124/80  Pulse: 90  Height: 5'  3.5" (1.613 m)  Weight: 158 lb 3.2 oz (71.759 kg)  SpO2: 97%         Assessment & Plan:     ICD-9-CM ICD-10-CM   1. Mediastinal mass 786.6 R22.2 CT Chest W Contrast  2. Lung nodule 793.11 R91.1 CT Chest W Contrast  3. Pulmonary embolism 415.19 I26.99     #Mediastinal Mass and Lung nodule - left lower lobe 7mm - initial dx 05/24/15  - repeat CT chest with contrast early September 2016 (ok if mid-sept 2016) - timing for this was discussed with radiologist while she was inpatient  #Pulmonary embolism - dx early June 016  - clinically improved - continue xarelto - recommend 6-24 months treatment - will reasses at followup duration - no need for followup echo now  #FOllowup - sept 2016 after CT chest   Dr. Kalman Shan, M.D., [facility].C.P Pulmonary and Critical Care Medicine Staff Physician  System  Pulmonary and Critical Care Pager: [phone], If no answer or between  15:00h - 7:00h: call [phone]  07/15/2015 12:02 PM

## 2015-07-15 NOTE — Patient Instructions (Signed)
ICD-9-CM ICD-10-CM   1. 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 Lung nodule 793.11 R91.1   3. Pulmonary embolism 415.19 I26.99    #Mediastinal Mass and Lung nodule - left lower lobe 7mm - initial dx 05/24/15  - repeat CT chest with contrast early September 2016 (ok if mid-sept 2016)  #Pulmonary embolism - dx early June 016  - clinically improved - continue xarelto - recommend 6-24 months treatment - will reasses at followup duration - no need for followup echo now  #FOllowup - sept 2016 after CT chest

## 2015-08-18 ENCOUNTER — Telehealth: Payer: Self-pay | Admitting: Internal Medicine

## 2015-08-18 NOTE — Telephone Encounter (Signed)
Pt's CT chest with contrast (for mediastinal mass and lung nodule) is needing a peer-to-peer with [employer].  Phone number: [phone], option 3. Tracking #: [id number].  Dr. Marchelle Gearing please advise. Thanks.

## 2015-08-22 ENCOUNTER — Telehealth: Payer: Self-pay | Admitting: Internal Medicine

## 2015-08-22 NOTE — Telephone Encounter (Signed)
Patient says that she received a letter stating that her CT was denied by her insurance.   I see that Pre-Cert request has been sent to [facility]  [facility] - can you follow up on this?  Thanks.

## 2015-08-22 NOTE — Telephone Encounter (Signed)
Insurance says case clsoed on 08/08/15 because they did NOT get clinical info like my note and radiology reports etc., So, now we have to "appeal" this - In order to appeal - please submit all "clinical paper work" with a written letter from me. 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

## 2015-08-22 NOTE — Telephone Encounter (Signed)
Libby, please advise.  

## 2015-08-26 NOTE — Telephone Encounter (Signed)
I obtained   The auth by calling back and starting a new request because the appeal could not be done after 7days of close date AUTH#[id number] Tobe Sos

## 2015-08-26 NOTE — Telephone Encounter (Signed)
See my other phone note. Insurance would not take my call

## 2015-08-26 NOTE — Telephone Encounter (Signed)
Kristen Shan, MD at 08/22/2015 2:40 PM     Status: Signed       Expand All Collapse All   Insurance says case clsoed on 08/08/15 because they did NOT get clinical info like my note and radiology reports etc., So, now we have to "appeal" this - In order to appeal - please submit all "clinical paper work" with a written letter from me. 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

## 2015-08-26 NOTE — Telephone Encounter (Signed)
Spoke with Libby-[facility]; states that MR has the pert cert information needed to get this taken care of. Will forward to MR and [facility] as reminder.

## 2015-08-28 NOTE — Telephone Encounter (Signed)
i called the ins company and got this precerted Kristen Contreras

## 2015-08-28 NOTE — Telephone Encounter (Signed)
Please advise PCC;s thanks 

## 2015-09-01 ENCOUNTER — Telehealth: Payer: Self-pay | Admitting: Internal Medicine

## 2015-09-01 DIAGNOSIS — E1165 Type 2 diabetes mellitus with hyperglycemia: Secondary | ICD-10-CM

## 2015-09-01 DIAGNOSIS — IMO0002 Reserved for concepts with insufficient information to code with codable children: Secondary | ICD-10-CM

## 2015-09-01 NOTE — Telephone Encounter (Signed)
Pt is scheduled for CT w/ contrast. Pt needs lab work at least on Thursday since she is diabetic. pt will need stat BMET.  Called pt and LMTCB to make aware

## 2015-09-01 NOTE — Telephone Encounter (Signed)
Called and spoke to pt. Informed pt she will need to have BMET before CT. Pt stated she was informed that she already has an appt with lab to have her BMET drawn at 0900 on 9.16.16. Order placed as STAT. Nothing further needed at this time.

## 2015-09-01 NOTE — Telephone Encounter (Signed)
Patient returned call and can be reached at [phone].

## 2015-09-05 ENCOUNTER — Other Ambulatory Visit: Payer: BLUE CROSS/BLUE SHIELD

## 2015-09-05 ENCOUNTER — Other Ambulatory Visit (INDEPENDENT_AMBULATORY_CARE_PROVIDER_SITE_OTHER): Payer: BLUE CROSS/BLUE SHIELD

## 2015-09-05 ENCOUNTER — Ambulatory Visit (INDEPENDENT_AMBULATORY_CARE_PROVIDER_SITE_OTHER)
Admission: RE | Admit: 2015-09-05 | Discharge: 2015-09-05 | Disposition: A | Discharge status: Home / Self Care | Payer: BLUE CROSS/BLUE SHIELD | Source: Ambulatory Visit | Attending: Internal Medicine | Admitting: Internal Medicine

## 2015-09-05 DIAGNOSIS — R911 Solitary pulmonary nodule: Secondary | ICD-10-CM | POA: Diagnosis not present

## 2015-09-05 DIAGNOSIS — E1165 Type 2 diabetes mellitus with hyperglycemia: Secondary | ICD-10-CM | POA: Diagnosis not present

## 2015-09-05 DIAGNOSIS — R222 Localized swelling, mass and lump, trunk: Secondary | ICD-10-CM | POA: Diagnosis not present

## 2015-09-05 DIAGNOSIS — IMO0002 Reserved for concepts with insufficient information to code with codable children: Secondary | ICD-10-CM

## 2015-09-05 DIAGNOSIS — J9859 Other diseases of mediastinum, not elsewhere classified: Secondary | ICD-10-CM

## 2015-09-05 LAB — BASIC METABOLIC PANEL
BUN: 22 mg/dL (ref 6–23)
CO2: 27 meq/L (ref 19–32)
CREATININE: 1.02 mg/dL (ref 0.40–1.20)
Calcium: 10.1 mg/dL (ref 8.4–10.5)
Chloride: 102 mEq/L (ref 96–112)
GFR: 58.26 mL/min — AB (ref >60.00)
GLUCOSE: 168 mg/dL — AB (ref 70–99)
Potassium: 4.6 mEq/L (ref 3.5–5.1)
SODIUM: 139 meq/L (ref 135–145)

## 2015-09-05 MED ORDER — IOHEXOL 300 MG/ML  SOLN
80.0000 mL | Freq: Once | INTRAMUSCULAR | Status: AC | PRN
  Administered 2015-09-05: 80 mL via INTRAVENOUS

## 2015-09-08 NOTE — Telephone Encounter (Signed)
IMPRESSION: 1. Stable last lower lobe pulmonary nodule. Recommend follow-up CT and in 6 months from today's month per Fleischner criteria. 2. 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 Recommend attention on follow-up at time of pulmonary nodule follow-up as above. 3. Clearing of pulmonary emboli and resolution of pleural fluid. 4. Nodularity of the RIGHT lobe of thyroid gland. Recommend ultrasound for evaluation. 5. 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 6. Moderate hiatal hernia.   Electronically Signed  By: Genevive Bi M.D.  On: 09/05/2015 14:16        Several findings will discuss 09/10/15 visit  Dr. Kalman Shan, M.D., [facility].C.P Pulmonary and Critical Care Medicine Staff Physician [facility] [place] Pulmonary and Critical Care Pager: [phone], If no answer or between  15:00h - 7:00h: call [phone]  09/08/2015 1:52 AM

## 2015-09-10 ENCOUNTER — Ambulatory Visit (INDEPENDENT_AMBULATORY_CARE_PROVIDER_SITE_OTHER): Payer: BLUE CROSS/BLUE SHIELD | Admitting: Internal Medicine

## 2015-09-10 ENCOUNTER — Encounter: Payer: Self-pay | Admitting: Internal Medicine

## 2015-09-10 VITALS — BP 110/78 | HR 85 | Ht 63.5 in | Wt 152.2 lb

## 2015-09-10 DIAGNOSIS — K449 Diaphragmatic hernia without obstruction or gangrene: Secondary | ICD-10-CM

## 2015-09-10 DIAGNOSIS — J9859 Other diseases of mediastinum, not elsewhere classified: Secondary | ICD-10-CM

## 2015-09-10 DIAGNOSIS — R911 Solitary pulmonary nodule: Secondary | ICD-10-CM

## 2015-09-10 DIAGNOSIS — E041 Nontoxic single thyroid nodule: Secondary | ICD-10-CM

## 2015-09-10 DIAGNOSIS — Z23 Encounter for immunization: Secondary | ICD-10-CM | POA: Diagnosis not present

## 2015-09-10 DIAGNOSIS — R222 Localized swelling, mass and lump, trunk: Secondary | ICD-10-CM

## 2015-09-10 DIAGNOSIS — I2699 Other pulmonary embolism without acute cor pulmonale: Secondary | ICD-10-CM

## 2015-09-10 NOTE — Progress Notes (Signed)
Subjective:    Patient ID: Kristen Contreras, female    DOB: 19-Dec-1951, 64 y.o.   MRN: [id number]  HPI   OV 09/10/2015  Chief Complaint  Patient presents with  . Follow-up    Pt here after CT chest. Pt denies any changed in her breathing. Pt denies cough, wheezing and CP/tightness.    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 - has completed 3 months of xarelto. Denies dyspnea or chest pain. CT scan of the chest mid-September 2016 pulmonary embolism was resolved. Feels well. No trauma.  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 CT scan of the chest September 2016 shows these are stable. Radiology is recommending serial follow-up in another 6 months  #New findings that I incidental on CT scan of the chest to hiatal hernia. She tells me that she knows about this. She had severe symptoms of dysphagia and acid reflux several years ago but currently is asymptomatic. She does not have a gastroenterologist. Other issue is right lobe thyroid gland nodule. She is not aware of this. I lasted follow these up with a primary care physician   CT chest   IMPRESSION: 1. Stable last lower lobe pulmonary nodule. Recommend follow-up CT and in 6 months from today's month per Fleischner criteria. 2. 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 Recommend attention on follow-up at time of pulmonary nodule follow-up as above. 3. Clearing of pulmonary emboli and resolution of pleural fluid. 4. Nodularity of the RIGHT lobe of thyroid gland. Recommend ultrasound for evaluation. 5. 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 6. Moderate hiatal hernia.   Electronically Signed  By: Suzy Bouchard M.D.  On: 09/05/2015 14:16         Current outpatient prescriptions:  .  glucose monitoring kit (FREESTYLE) monitoring kit, 1 each by Does not apply route as needed for other., Disp: 1 each, Rfl:  11 .  Insulin Detemir (LEVEMIR FLEXPEN) 100 UNIT/ML Pen, Inject 8 Units into the skin daily at 10 pm. (Patient taking differently: Inject 10 Units into the skin daily at 10 pm. ), Disp: 15 mL, Rfl: 11 .  Insulin Pen Needle (PEN NEEDLES 3/16") 31G X 5 MM MISC, 100, Disp: 100 each, Rfl: 1 .  pantoprazole (PROTONIX) 40 MG tablet, Take 1 tablet (40 mg total) by mouth daily., Disp: 30 tablet, Rfl: 2 .  rivaroxaban (XARELTO) 20 MG TABS tablet, Take 1 tablet (20 mg total) by mouth daily with supper., Disp: 30 tablet, Rfl: 6 .  sitaGLIPtin (JANUVIA) 100 MG tablet, Take 100 mg by mouth daily., Disp: , Rfl:      Review of Systems  Constitutional: Negative for fever and unexpected weight change.  HENT: Negative for congestion, dental problem, ear pain, nosebleeds, postnasal drip, rhinorrhea, sinus pressure, sneezing, sore throat and trouble swallowing.   Eyes: Negative for redness and itching.  Respiratory: Negative for cough, chest tightness, shortness of breath and wheezing.   Cardiovascular: Negative for palpitations and leg swelling.  Gastrointestinal: Negative for nausea and vomiting.  Genitourinary: Negative for dysuria.  Musculoskeletal: Negative for joint swelling.  Skin: Negative for rash.  Neurological: Negative for headaches.  Hematological: Does not bruise/bleed easily.  Psychiatric/Behavioral: Negative for dysphoric mood. The patient is not nervous/anxious.        Objective:   Physical Exam  Constitutional: She is oriented to person, place, and time. She appears well-developed and well-nourished. No distress.  HENT:  Head: Normocephalic and atraumatic.  Right Ear: External ear normal.  Left Ear: External ear normal.  Mouth/Throat: Oropharynx is clear and moist. No oropharyngeal exudate.  Eyes: Conjunctivae and EOM are normal. Pupils are equal, round, and reactive to light. Right eye exhibits no discharge. Left eye exhibits no discharge. No scleral icterus.  Neck: Normal range of  motion. Neck supple. No JVD present. No tracheal deviation present. No thyromegaly present.  Cardiovascular: Normal rate, regular rhythm, normal heart sounds and intact distal pulses.  Exam reveals no gallop and no friction rub.   No murmur heard. Pulmonary/Chest: Effort normal and breath sounds normal. No respiratory distress. She has no wheezes. She has no rales. She exhibits no tenderness.  Abdominal: Soft. Bowel sounds are normal. She exhibits no distension and no mass. There is no tenderness. There is no rebound and no guarding.  Musculoskeletal: Normal range of motion. She exhibits no edema or tenderness.  Lymphadenopathy:    She has no cervical adenopathy.  Neurological: She is alert and oriented to person, place, and time. She has normal reflexes. No cranial nerve deficit. She exhibits normal muscle tone. Coordination normal.  Skin: Skin is warm and dry. No rash noted. She is not diaphoretic. No erythema. No pallor.  Psychiatric: She has a normal mood and affect. Her behavior is normal. Judgment and thought content normal.  Vitals reviewed.   Filed Vitals:   09/10/15 1053  BP: 110/78  Pulse: 85  Height: 5' 3.5" (1.613 m)  Weight: 152 lb 3.2 oz (69.037 kg)  SpO2: 98%         Assessment & Plan:     ICD-9-CM ICD-10-CM   1. 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 Lung nodule 793.11 R91.1   3. Pulmonary embolism 415.19 I26.99   4. Thyroid nodule 241.0 E04.1   5. Hiatal hernia 553.3 K44.9     #Mediastinal Mass and Lung nodule - left lower lobe 67m - initial dx 05/24/15  - stable on CT sept 2016  - do fu CT CT chest with contrast early end march /early April 2017  #Pulmonary embolism - dx early June 016  - clinically improved - continue xarelto - recommend 6-24 months treatment - will reasses at followup duration - no need for followup echo now - flu shot 09/10/2015   #Thryoid nodule and Hiatal hernia  -= discuss with PCP No PCP Per Patient   #FOllowup -  after CT chest  spring 2017   Dr. MBrand Males M.D., F.C.C.P Pulmonary and Critical Care Medicine Staff Physician [facility] Pager: [phone] If no answer or between  15:00h - 7:00h: call [phone]  09/10/2015 11:19 AM

## 2015-09-10 NOTE — Addendum Note (Signed)
Addended by: Marcellus Scott on: 09/10/2015 11:35 AM   Modules accepted: Orders

## 2015-09-10 NOTE — Patient Instructions (Addendum)
ICD-9-CM ICD-10-CM   1. 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 Lung nodule 793.11 R91.1   3. Pulmonary embolism 415.19 I26.99   4. Thyroid nodule 241.0 E04.1   5. Hiatal hernia 553.3 K44.9      #Mediastinal Mass and Lung nodule - left lower lobe 7mm - initial dx 05/24/15  - stable on CT sept 2016  - do fu CT CT chest with contrast early end march /early April 2017  #Pulmonary embolism - dx early June 016  - clinically improved - continue xarelto - recommend 6-24 months treatment - will reasses at followup duration - no need for followup echo now - flu shot 09/10/2015   #Thryoid nodule and Hiatal hernia  -= discuss with PCP No PCP Per Patient   #FOllowup -  after CT chest spring 2017

## 2015-09-25 ENCOUNTER — Other Ambulatory Visit: Payer: Self-pay | Admitting: Internal Medicine

## 2015-09-25 DIAGNOSIS — E041 Nontoxic single thyroid nodule: Secondary | ICD-10-CM

## 2015-09-30 ENCOUNTER — Other Ambulatory Visit: Payer: BLUE CROSS/BLUE SHIELD

## 2015-10-09 ENCOUNTER — Ambulatory Visit
Admission: RE | Admit: 2015-10-09 | Discharge: 2015-10-09 | Disposition: A | Discharge status: Home / Self Care | Payer: BLUE CROSS/BLUE SHIELD | Source: Ambulatory Visit | Attending: Internal Medicine | Admitting: Internal Medicine

## 2015-10-09 DIAGNOSIS — E041 Nontoxic single thyroid nodule: Secondary | ICD-10-CM

## 2015-10-22 ENCOUNTER — Telehealth: Payer: Self-pay | Admitting: Internal Medicine

## 2015-10-22 ENCOUNTER — Other Ambulatory Visit: Payer: Self-pay | Admitting: Internal Medicine

## 2015-10-22 DIAGNOSIS — E042 Nontoxic multinodular goiter: Secondary | ICD-10-CM

## 2015-10-22 NOTE — Telephone Encounter (Signed)
Pt is having a thyroid biopsy done. Will have to come off of Xarelto one day prior. The surgeon's office will need this is writing if this is okay.  MR please advise.

## 2015-10-22 NOTE — Telephone Encounter (Signed)
lmtcb X1 for Gail.   Letter typed and faxed to below fax #.

## 2015-10-22 NOTE — Telephone Encounter (Signed)
Spoke with Dondra SpryGail and notified letter has been faxed  Nothing further needed

## 2015-10-22 NOTE — Telephone Encounter (Signed)
Thyroid being vascular I prefer she come off 3d prior and start 1d after biopsy provided no bleeding

## 2015-11-18 ENCOUNTER — Other Ambulatory Visit (HOSPITAL_COMMUNITY)
Admission: RE | Admit: 2015-11-18 | Discharge: 2015-11-18 | Disposition: A | Discharge status: Home / Self Care | Payer: BLUE CROSS/BLUE SHIELD | Source: Ambulatory Visit | Attending: Radiology | Admitting: Radiology

## 2015-11-18 ENCOUNTER — Ambulatory Visit
Admission: RE | Admit: 2015-11-18 | Discharge: 2015-11-18 | Disposition: A | Discharge status: Home / Self Care | Payer: BLUE CROSS/BLUE SHIELD | Source: Ambulatory Visit | Attending: Internal Medicine | Admitting: Internal Medicine

## 2015-11-18 DIAGNOSIS — E042 Nontoxic multinodular goiter: Secondary | ICD-10-CM

## 2015-11-18 DIAGNOSIS — E041 Nontoxic single thyroid nodule: Secondary | ICD-10-CM | POA: Insufficient documentation

## 2016-01-19 ENCOUNTER — Telehealth: Payer: Self-pay | Admitting: Internal Medicine

## 2016-01-19 NOTE — Telephone Encounter (Signed)
Spoke with pt. States that her PCP refilled her Xarelto for her. Her PCP gave her an rx for Xarelto  instead of the . A 90 day supply was sent in. Pt wants to know if she can take the  tablets or does she need to take  daily.  MR - please advise. Thanks.

## 2016-01-20 MED ORDER — RIVAROXABAN 20 MG PO TABS: 20.0000 mg | ORAL_TABLET | Freq: Every day | ORAL | Status: DC

## 2016-01-20 NOTE — Telephone Encounter (Signed)
She needs to go back to  per day. Ensure fu in spring 2017 per my OV note in sept 2016

## 2016-01-20 NOTE — Telephone Encounter (Signed)
Pt has not heard anything [phone]

## 2016-01-20 NOTE — Telephone Encounter (Signed)
Spoke with pt, aware of recs.  Scheduled ov for April 2017 with MR.  Nothing further needed.

## 2016-02-10 ENCOUNTER — Other Ambulatory Visit: Payer: Self-pay

## 2016-02-10 DIAGNOSIS — I2699 Other pulmonary embolism without acute cor pulmonale: Secondary | ICD-10-CM

## 2016-02-20 ENCOUNTER — Other Ambulatory Visit (INDEPENDENT_AMBULATORY_CARE_PROVIDER_SITE_OTHER): Payer: BLUE CROSS/BLUE SHIELD

## 2016-02-20 DIAGNOSIS — I2699 Other pulmonary embolism without acute cor pulmonale: Secondary | ICD-10-CM

## 2016-02-20 LAB — BASIC METABOLIC PANEL
BUN: 14 mg/dL (ref 6–23)
CHLORIDE: 103 meq/L (ref 96–112)
CO2: 26 meq/L (ref 19–32)
CREATININE: 0.88 mg/dL (ref 0.40–1.20)
Calcium: 9.8 mg/dL (ref 8.4–10.5)
GFR: 68.98 mL/min (ref >60.00)
GLUCOSE: 147 mg/dL — AB (ref 70–99)
Potassium: 4.2 mEq/L (ref 3.5–5.1)
Sodium: 140 mEq/L (ref 135–145)

## 2016-03-05 IMAGING — US US SOFT TISSUE HEAD/NECK
1 series · 14 of 25 positions shown · non-contrast
Comparison: None.

CLINICAL DATA: Thyroid nodule

EXAM:
THYROID ULTRASOUND
TECHNIQUE: Ultrasound examination of the thyroid gland and adjacent soft
tissues was performed.

[Series 1: us soft tissue head/neck · 0.08mm/px · 14 of 56 slices shown]
[im 1/56]
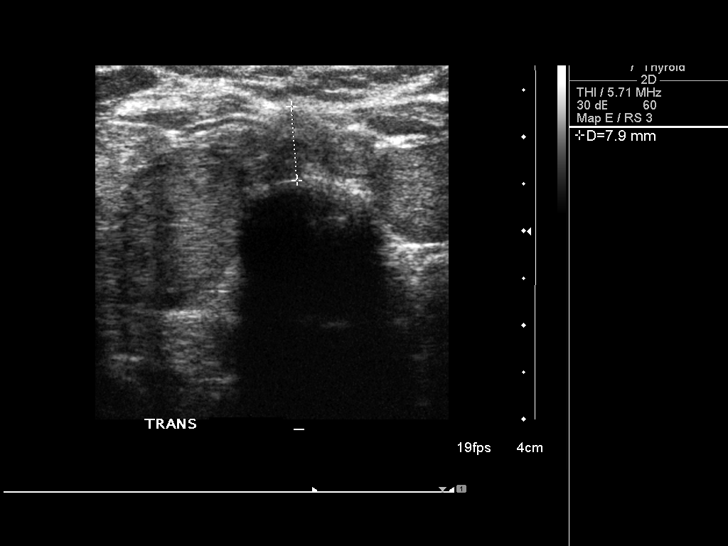
[im 5/56]
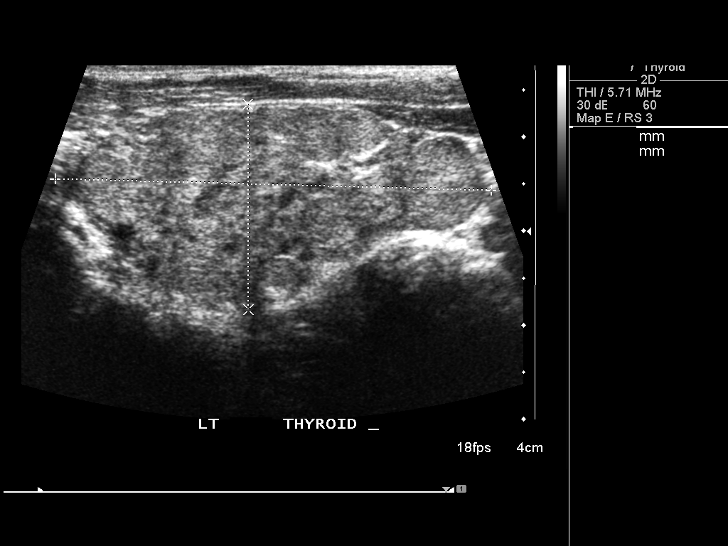
[im 10/56]
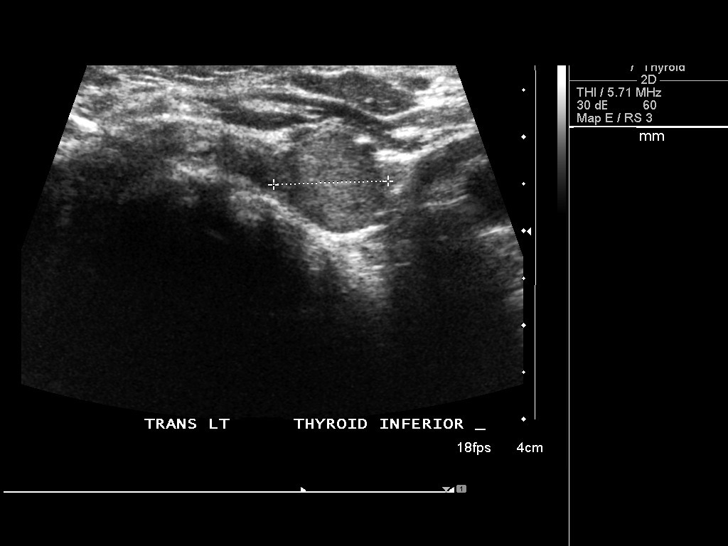
[im 14/56]
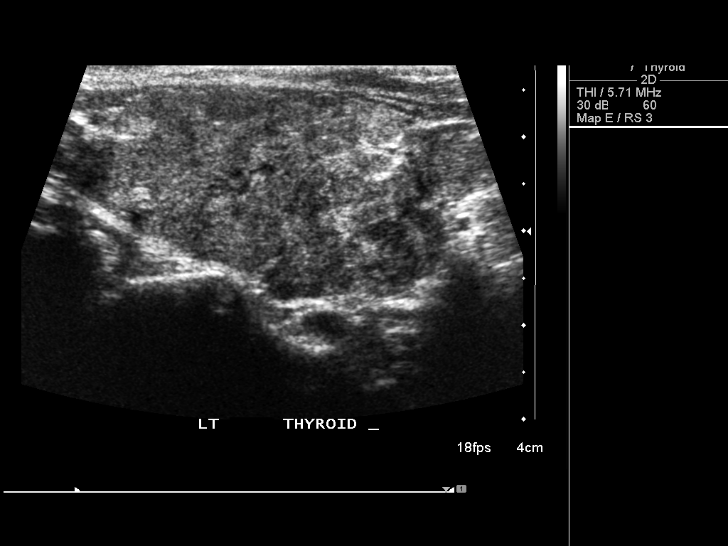
[im 19/56]
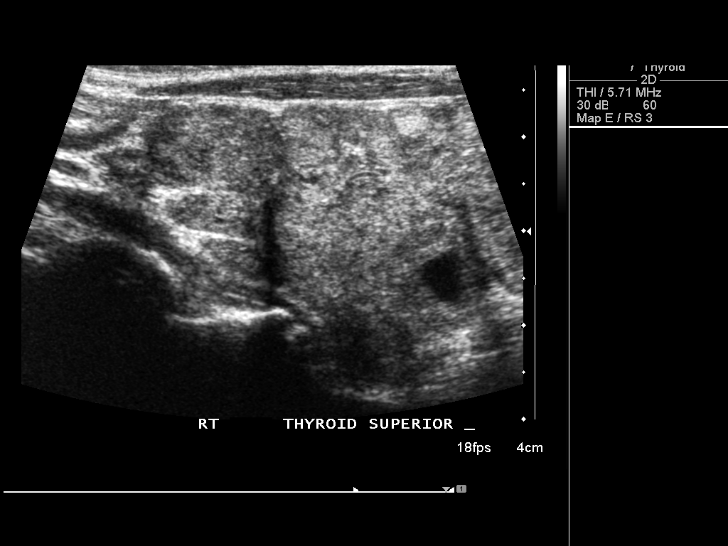
[im 21/56]
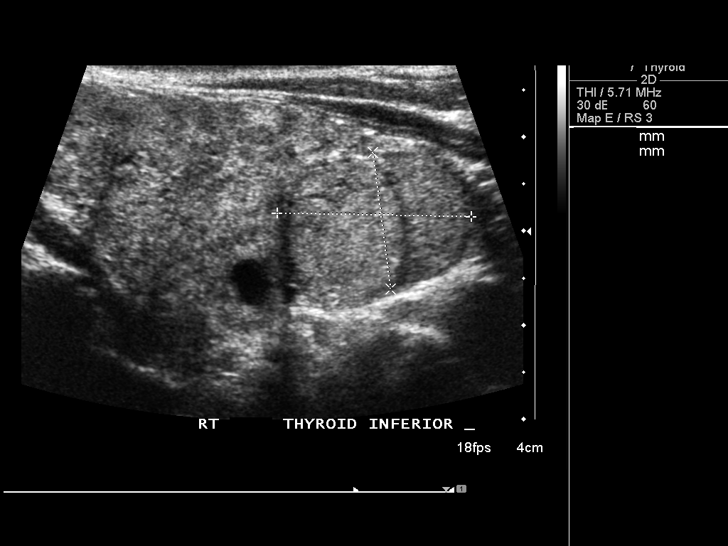
[im 26/56]
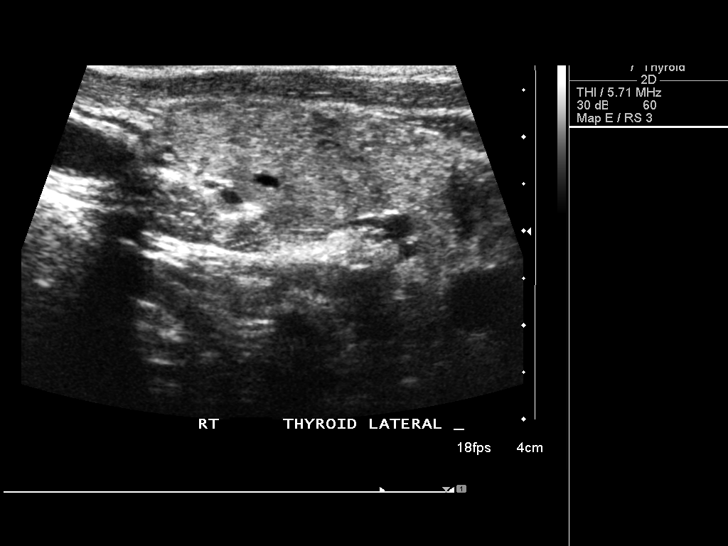
[im 30/56]
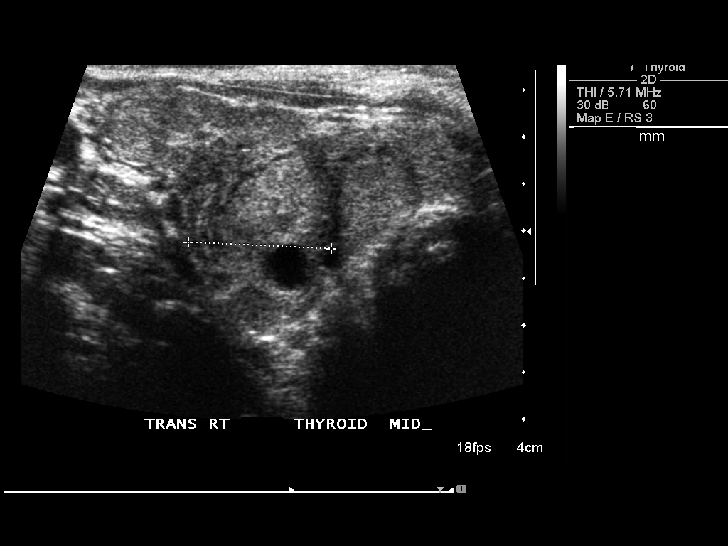
[im 35/56]
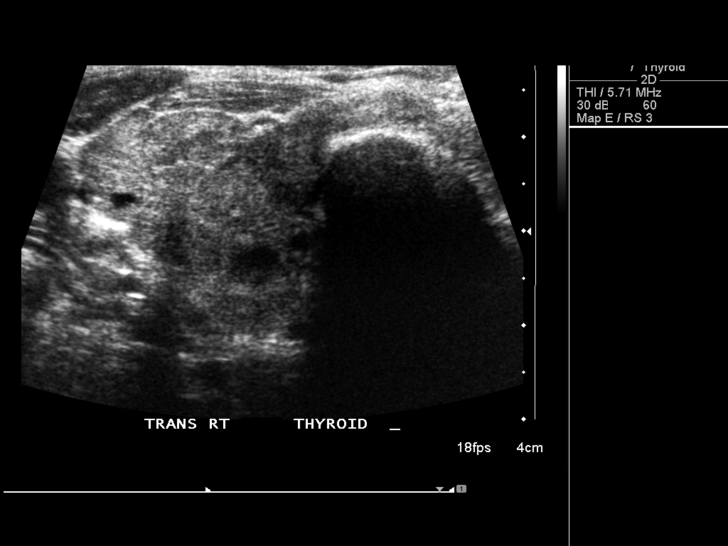
[im 37/56]
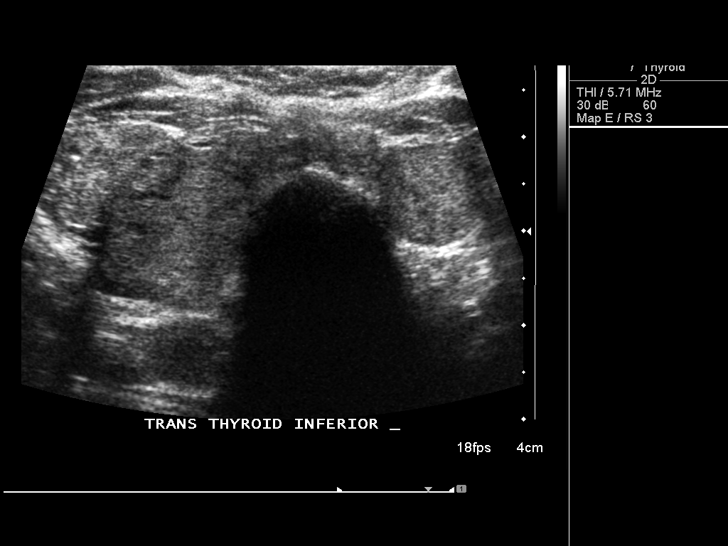
[im 42/56]
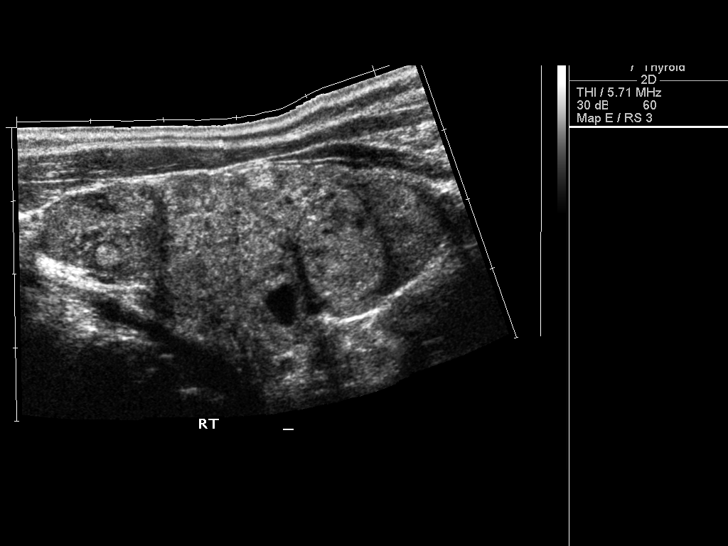
[im 46/56]
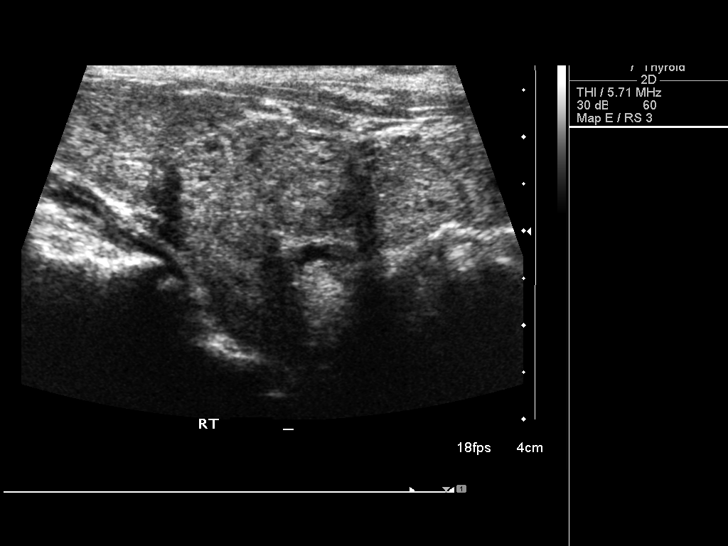
[im 51/56]
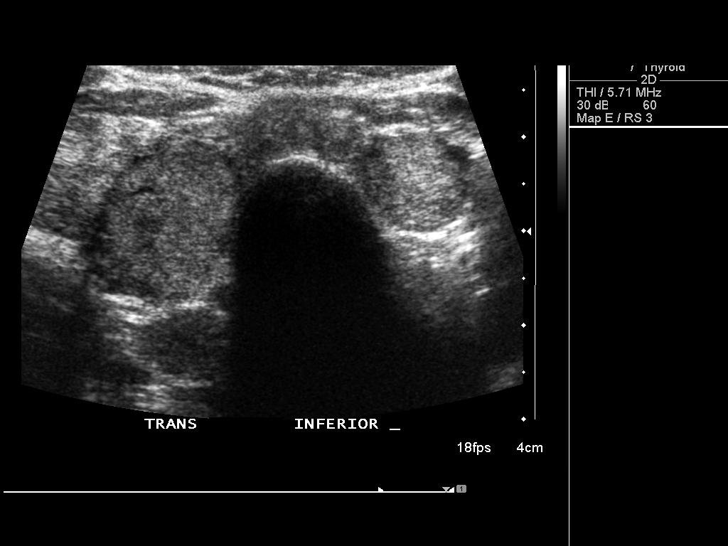
[im 56/56]
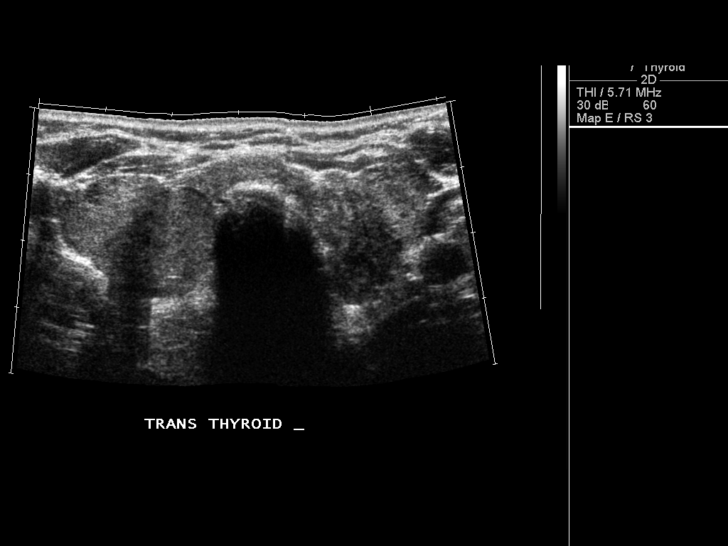

[14 of 25 positions shown; findings below may reference images not displayed]

FINDINGS: Right thyroid lobe

Measurements: 5.8 x 2.7 x 2.7 cm. Markedly heterogeneous and
hypervascular gland. 2.3 x 1.7 x 1.5 cm lower pole nodule. 2.1 x
x 1.8 cm more inferior lower pole nodule with calcification. These 2
lesions may represent pseudo nodules.

Left thyroid lobe

Measurements: 4.6 x 2.2 x 2.2 cm. 12 x 9 x 12 mm lower pole nodule
versus pseudo nodule. The gland is markedly heterogeneous.

Isthmus

Thickness: 8 mm.  No nodules visualized.

Lymphadenopathy

None visualized.
IMPRESSION: Markedly heterogeneous and hypervascular gland. In the lower pole of
the right lobe are 2 nodules measuring 2.3 and 2.1 cm. They may
simply represent pseudo nodules. Findings meet consensus criteria
for biopsy. Ultrasound-guided fine needle aspiration should be
considered, as per the consensus statement: Management of Thyroid
Nodules Detected at US: Society of Radiologists in Ultrasound

## 2016-03-22 ENCOUNTER — Encounter: Payer: Self-pay | Admitting: Internal Medicine

## 2016-03-22 ENCOUNTER — Ambulatory Visit (INDEPENDENT_AMBULATORY_CARE_PROVIDER_SITE_OTHER)
Admission: RE | Admit: 2016-03-22 | Discharge: 2016-03-22 | Disposition: A | Discharge status: Home / Self Care | Payer: BLUE CROSS/BLUE SHIELD | Source: Ambulatory Visit | Attending: Internal Medicine | Admitting: Internal Medicine

## 2016-03-22 ENCOUNTER — Telehealth: Payer: Self-pay | Admitting: Internal Medicine

## 2016-03-22 ENCOUNTER — Ambulatory Visit (INDEPENDENT_AMBULATORY_CARE_PROVIDER_SITE_OTHER): Payer: BLUE CROSS/BLUE SHIELD | Admitting: Internal Medicine

## 2016-03-22 VITALS — BP 116/72 | HR 96 | Ht 64.0 in | Wt 137.6 lb

## 2016-03-22 DIAGNOSIS — I2699 Other pulmonary embolism without acute cor pulmonale: Secondary | ICD-10-CM

## 2016-03-22 DIAGNOSIS — J9859 Other diseases of mediastinum, not elsewhere classified: Secondary | ICD-10-CM

## 2016-03-22 DIAGNOSIS — R911 Solitary pulmonary nodule: Secondary | ICD-10-CM

## 2016-03-22 MED ORDER — IOPAMIDOL (ISOVUE-300) INJECTION 61%: 80.0000 mL | Freq: Once | INTRAVENOUS | Status: DC | PRN

## 2016-03-22 NOTE — Telephone Encounter (Signed)
Let Varney BilesDebra Contreras know that ct findings are all stable  NEx CT is only in 18-24 months per radiology She should see me for fu in April 2018   Ct Chest W Contrast  03/22/2016  CLINICAL DATA:  History of pulmonary embolus. On Xarelto. History of mediastinal mass. Followup lung mass. EXAM: CT CHEST WITH CONTRAST TECHNIQUE: Multidetector CT imaging of the chest was performed during intravenous contrast administration. CONTRAST:  80 cc of Omnipaque 300 COMPARISON:  09/05/2015 FINDINGS: Mediastinum: The heart size appears normal. No pericardial effusion. Within the anterior mediastinum there is a well-circumscribed fluid attenuating structure which measures 2 cm and is unchanged from previous exam, image 55 of series 2. The trachea appears patent and is midline. Moderate size hiatal hernia is identified. No axillary or supraclavicular adenopathy identified. Lungs/Pleura: No pleural fluid identified. In the left upper lobe there is a 3 x 3 mm nodule (3 mm mean diameter) on image 63 of series 3. Stable from previous exam. In the left lower lobe there is a 9 x 6 mm nodule (8 mm mean diameter) on image 112 of series 3 of series 112 of series 3. On the previous exam this had a mean diameter of 7 mm. Upper Abdomen: No suspicious liver abnormality. Numerous calcified granulomas within the spleen. The adrenal glands are normal. Unremarkable appearance of the visualized portions of the pancreas. Musculoskeletal: No aggressive lytic or sclerotic bone lesions identified. IMPRESSION: 1. Stable fluid attenuating structure in the anterior mediastinum, likely benign abnormality. 2. The left upper lobe lung nodule is stable with a mean diameter of 3 mm. The left lower lobe nodule has an mean diameter of 8 mm, previously 7 mm. Followup CT at 18-24 months (from today's scan) is considered optional for low-risk patients, but is recommended for high-risk patients. This recommendation follows the consensus statement: Guidelines  for Management of Incidental Pulmonary Nodules Detected on CT Images:From the Fleischner Society 2017; published online before print (10.1148/[id number]). 3. Hiatal hernia Electronically Signed   By: Signa Kellaylor  Stroud M.D.   On: 03/22/2016 12:15

## 2016-03-22 NOTE — Progress Notes (Signed)
Subjective:     Patient ID: Kristen Contreras, female   DOB: 1951-12-18, 65 y.o.   MRN: [id number]  HPI   OV 09/10/2015  Chief Complaint  Patient presents with  . Follow-up    Pt here after CT chest. Pt denies any changed in her breathing. Pt denies cough, wheezing and CP/tightness.    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 - has completed 3 months of xarelto. Denies dyspnea or chest pain. CT scan of the chest mid-September 2016 pulmonary embolism was resolved. Feels well. No trauma.  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 CT scan of the chest September 2016 shows these are stable. Radiology is recommending serial follow-up in another 6 months  #New findings that I incidental on CT scan of the chest to hiatal hernia. She tells me that she knows about this. She had severe symptoms of dysphagia and acid reflux several years ago but currently is asymptomatic. She does not have a gastroenterologist. Other issue is right lobe thyroid gland nodule. She is not aware of this. I lasted follow these up with a primary care physician   CT chest   IMPRESSION: 1. Stable last lower lobe pulmonary nodule. Recommend follow-up CT and in 6 months from today's month per Fleischner criteria. 2. 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 Recommend attention on follow-up at time of pulmonary nodule follow-up as above. 3. Clearing of pulmonary emboli and resolution of pleural fluid. 4. Nodularity of the RIGHT lobe of thyroid gland. Recommend ultrasound for evaluation. 5. 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 6. Moderate hiatal hernia.   Electronically Signed  By: Suzy Bouchard M.D.  On: 09/05/2015 14:16      OV 03/22/2016  Chief Complaint  Patient presents with  . Follow-up    Pt here after CT chest. Pt states her breathing is doing well. Pt denies SOB, cough, wheezing,  CP/tightness, and wheezing.    FU PE - 10 mnths xarelto cmpleted. ASymptomatic from PE and bleeding standpoint., FEels real well. 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 My persnal visualization - looks stable. Formal results pending as of 11:28 AM 03/22/2016      No results found.     Current outpatient prescriptions:  .  glucose monitoring kit (FREESTYLE) monitoring kit, 1 each by Does not apply route as needed for other., Disp: 1 each, Rfl: 11 .  Insulin Detemir (LEVEMIR FLEXPEN) 100 UNIT/ML Pen, Inject 8 Units into the skin daily at 10 pm. (Patient taking differently: Inject 12 Units into the skin daily at 10 pm. ), Disp: 15 mL, Rfl: 11 .  Insulin Pen Needle (PEN NEEDLES 3/16") 31G X 5 MM MISC, 100, Disp: 100 each, Rfl: 1 .  pantoprazole (PROTONIX) 40 MG tablet, Take 1 tablet (40 mg total) by mouth daily., Disp: 30 tablet, Rfl: 2 .  rivaroxaban (XARELTO) 20 MG TABS tablet, Take 1 tablet (20 mg total) by mouth daily with supper., Disp: 30 tablet, Rfl: 6 .  sitaGLIPtin (JANUVIA) 100 MG tablet, Take 100 mg by mouth daily., Disp: , Rfl:  No current facility-administered medications for this visit.  Facility-Administered Medications Ordered in Other Visits:  .  iopamidol (ISOVUE-300) 61 % injection 80 mL, 80 mL, Intravenous, Once PRN, Brand Males, MD      Immunization  History  Administered Date(s) Administered  . Influenza,inj,Quad PF,36+ Mos 09/10/2015  . Pneumococcal Polysaccharide-23 05/27/2015      Review of Systems     Objective:   Physical Exam  Constitutional: She is oriented to person, place, and time. She appears well-developed and well-nourished. No distress.  HENT:  Head: Normocephalic and atraumatic.  Right Ear: External ear normal.  Left Ear: External ear normal.  Mouth/Throat: Oropharynx is clear and moist. No oropharyngeal exudate.  Eyes: Conjunctivae and  EOM are normal. Pupils are equal, round, and reactive to light. Right eye exhibits no discharge. Left eye exhibits no discharge. No scleral icterus.  Neck: Normal range of motion. Neck supple. No JVD present. No tracheal deviation present. No thyromegaly present.  Cardiovascular: Normal rate, regular rhythm, normal heart sounds and intact distal pulses.  Exam reveals no gallop and no friction rub.   No murmur heard. Pulmonary/Chest: Effort normal and breath sounds normal. No respiratory distress. She has no wheezes. She has no rales. She exhibits no tenderness.  Abdominal: Soft. Bowel sounds are normal. She exhibits no distension and no mass. There is no tenderness. There is no rebound and no guarding.  Musculoskeletal: Normal range of motion. She exhibits no edema or tenderness.  Lymphadenopathy:    She has no cervical adenopathy.  Neurological: She is alert and oriented to person, place, and time. She has normal reflexes. No cranial nerve deficit. She exhibits normal muscle tone. Coordination normal.  Skin: Skin is warm and dry. No rash noted. She is not diaphoretic. No erythema. No pallor.  Psychiatric: She has a normal mood and affect. Her behavior is normal. Judgment and thought content normal.  Vitals reviewed.   Filed Vitals:   03/22/16 1050  BP: 116/72  Pulse: 96  Height: 5' 4"  (1.626 m)  Weight: 137 lb 9.6 oz (62.415 kg)  SpO2: 98%        Assessment:       ICD-9-CM ICD-10-CM   1. Mediastinal mass 786.6 J98.59   2. Lung nodule 793.11 R91.1   3. Pulmonary embolism, other ([place])  I26.99        Plan:     #Mediastinal Mass and Lung nodule - left lower lobe 65m - initial dx 05/24/15  - stable on CT sept 2016  - awaiting results of CT chest 03/22/2016  - next ct dependent on result 03/22/2016   #Pulmonary embolism - dx early June 016  - clinically improved - continue xarelto - -24 months treatment based on emerging research - d-dimer blood test April 2018 to decide on stop  date - will order at next visit - return April 2018  - be mindful of bleeding risk at all times   #FOllowup - will call you about next CT chest timing - return to see me April 2018 or sooner if needed

## 2016-03-22 NOTE — Patient Instructions (Addendum)
#  Mediastinal Mass and Lung nodule - left lower lobe 7mm - initial dx 05/24/15  - stable on CT sept 2016  - awaiting results of CT chest 03/22/2016  - next ct dependent on result 03/22/2016   #Pulmonary embolism - dx early June 016  - clinically improved - continue xarelto - -24 months treatment based on emerging research - d-dimer blood test April 2018 to decide on stop date - will order at next visit - return April 2018  - be mindful of bleeding risk at all times   #FOllowup - will call you about next CT chest timing - return to see me April 2018 or sooner if needed

## 2016-03-23 NOTE — Telephone Encounter (Signed)
lmtcb for pt.  

## 2016-03-23 NOTE — Telephone Encounter (Signed)
Spoke with pt, aware of results/recs.  Recall placed in chart for rov.  Nothing further needed.

## 2016-05-13 ENCOUNTER — Other Ambulatory Visit: Payer: Self-pay | Admitting: Surgery

## 2016-05-13 DIAGNOSIS — E041 Nontoxic single thyroid nodule: Secondary | ICD-10-CM

## 2016-06-04 ENCOUNTER — Ambulatory Visit
Admission: RE | Admit: 2016-06-04 | Discharge: 2016-06-04 | Disposition: A | Discharge status: Home / Self Care | Payer: BLUE CROSS/BLUE SHIELD | Source: Ambulatory Visit | Attending: Surgery | Admitting: Surgery

## 2016-06-04 DIAGNOSIS — E041 Nontoxic single thyroid nodule: Secondary | ICD-10-CM

## 2016-08-16 ENCOUNTER — Other Ambulatory Visit: Payer: Self-pay | Admitting: Internal Medicine

## 2017-03-25 ENCOUNTER — Telehealth: Payer: Self-pay | Admitting: Internal Medicine

## 2017-03-25 MED ORDER — RIVAROXABAN 20 MG PO TABS: ORAL_TABLET | ORAL | 0 refills | Status: DC

## 2017-03-25 NOTE — Telephone Encounter (Signed)
Patient called back advised that rx was sent to pharmacy - pt needs nothing further -pr

## 2017-03-25 NOTE — Telephone Encounter (Signed)
lmtcb X1 for pt. rx has been sent to verified pharmacy.

## 2017-03-31 ENCOUNTER — Telehealth: Payer: Self-pay | Admitting: Internal Medicine

## 2017-03-31 ENCOUNTER — Other Ambulatory Visit: Payer: Self-pay | Admitting: Internal Medicine

## 2017-03-31 MED ORDER — RIVAROXABAN 20 MG PO TABS: ORAL_TABLET | ORAL | 0 refills | Status: DC

## 2017-03-31 NOTE — Telephone Encounter (Signed)
Called and spoke with pt and she is aware of refill that has been sent to the pharmacy.  She will call back for any concerns.

## 2017-04-01 ENCOUNTER — Telehealth: Payer: Self-pay | Admitting: Internal Medicine

## 2017-04-01 NOTE — Telephone Encounter (Signed)
Spoke with pt, states her pharmacy is refusing to fill her xarelto- I advised that we sent in a rx yesterday afternoon to verified pharmacy.   Called pharmacy, rx is ready to be filled but in their system pt's last name is listed as Hulett instead of Millay as pt presented to pharmacy.   Called pt back, aware of discrepancy.  Nothing further needed.

## 2017-04-01 NOTE — Telephone Encounter (Signed)
Pt calling again about medication refill.Kristen Contreras

## 2017-04-01 NOTE — Telephone Encounter (Signed)
lmtcb X1 for pt to relay results/recs.  

## 2017-04-07 ENCOUNTER — Other Ambulatory Visit: Payer: Self-pay | Admitting: Internal Medicine

## 2017-04-07 MED ORDER — RIVAROXABAN 20 MG PO TABS: ORAL_TABLET | ORAL | 0 refills | Status: DC

## 2017-04-08 ENCOUNTER — Other Ambulatory Visit: Payer: BLUE CROSS/BLUE SHIELD

## 2017-04-08 ENCOUNTER — Ambulatory Visit (INDEPENDENT_AMBULATORY_CARE_PROVIDER_SITE_OTHER): Payer: BLUE CROSS/BLUE SHIELD | Admitting: Internal Medicine

## 2017-04-08 DIAGNOSIS — I2609 Other pulmonary embolism with acute cor pulmonale: Secondary | ICD-10-CM

## 2017-04-08 DIAGNOSIS — J9859 Other diseases of mediastinum, not elsewhere classified: Secondary | ICD-10-CM | POA: Diagnosis not present

## 2017-04-08 NOTE — Patient Instructions (Signed)
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

## 2017-04-08 NOTE — Progress Notes (Signed)
Subjective:     Patient ID: Kristen Contreras, female   DOB: [birth year], 66 y.o.   MRN: [id number]  HPI   OV 09/10/2015  Chief Complaint  Patient presents with  . Follow-up    Pt here after CT chest. Pt denies any changed in her breathing. Pt denies cough, wheezing and CP/tightness.    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 - has completed 3 months of xarelto. Denies dyspnea or chest pain. CT scan of the chest mid-September 2016 pulmonary embolism was resolved. Feels well. No trauma.  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 CT scan of the chest September 2016 shows these are stable. Radiology is recommending serial follow-up in another 6 months  #New findings that I incidental on CT scan of the chest to hiatal hernia. She tells me that she knows about this. She had severe symptoms of dysphagia and acid reflux several years ago but currently is asymptomatic. She does not have a gastroenterologist. Other issue is right lobe thyroid gland nodule. She is not aware of this. I lasted follow these up with a primary care physician   CT chest   IMPRESSION: 1. Stable last lower lobe pulmonary nodule. Recommend follow-up CT and in 6 months from today's month per Fleischner criteria. 2. 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 Recommend attention on follow-up at time of pulmonary nodule follow-up as above. 3. Clearing of pulmonary emboli and resolution of pleural fluid. 4. Nodularity of the RIGHT lobe of thyroid gland. Recommend ultrasound for evaluation. 5. 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 6. Moderate hiatal hernia.   Electronically Signed  By: Suzy Bouchard M.D.  On: 09/05/2015 14:16      OV 03/22/2016  Chief Complaint  Patient presents with  . Follow-up    Pt here after CT chest. Pt states her breathing is doing well. Pt denies SOB, cough, wheezing,  CP/tightness, and wheezing.    FU PE - 10 mnths xarelto cmpleted. ASymptomatic from PE and bleeding standpoint., FEels real well. 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 My persnal visualization - looks stable.      OV 04/08/2017  Chief Complaint  Patient presents with  . Follow-up    one year follow up  - pt denies CP/tightness, SOB, cough, and f/c/s.    Follow-up pulmonary embolism from June 2016. She continues to be on full dose Xarelto. She feels really well. Asymptomatic. No bleeding complications or respiratory issues.  Follow-up left lower lobe nodule and mediastinal mass: Last CT was in spring 2017:2 years CT was recommended for spring 2019.    has a past medical history of GERD (gastroesophageal reflux disease); Pulmonary embolism ([place]) (05/24/2015); and Type II diabetes mellitus ([place]) (dx'd 05/2015).   reports that she has never smoked. She has never used smokeless tobacco.  Past Surgical History:  Procedure Laterality Date  . CESAREAN SECTION  1990  . TONSILLECTOMY  1959    Allergies  Allergen Reactions  . Penicillins Itching and Rash    Immunization History  Administered Date(s) Administered  . Influenza,inj,Quad PF,36+ Mos 09/10/2015  . Pneumococcal Polysaccharide-23 05/27/2015    Family History  Problem Relation Age of Onset  . Heart attack Father 33  . Heart attack Mother 39     Current Outpatient Prescriptions:  .  glucose monitoring kit (FREESTYLE) monitoring kit, 1 each by Does not apply route as needed for other., Disp: 1 each, Rfl: 11 .  Insulin Detemir (LEVEMIR FLEXPEN) 100 UNIT/ML Pen, Inject 8 Units into the skin daily at 10 pm. (Patient taking differently: Inject 12 Units into the skin daily at 10 pm. ), Disp: 15 mL, Rfl: 11 .  Insulin Pen Needle (PEN NEEDLES 3/16") 31G X 5 MM MISC, 100, Disp: 100 each, Rfl: 1 .  pantoprazole (PROTONIX) 40 MG  tablet, Take 1 tablet (40 mg total) by mouth daily., Disp: 30 tablet, Rfl: 2 .  rivaroxaban (XARELTO) 20 MG TABS tablet, TAKE ONE TABLET BY MOUTH ONCE DAILY WITH  SUPPER- must keep office visit for future refills., Disp: 30 tablet, Rfl: 0 .  sitaGLIPtin (JANUVIA) 100 MG tablet, Take 100 mg by mouth daily., Disp: , Rfl:    Review of Systems     Objective:   Physical Exam  Constitutional: She is oriented to person, place, and time. She appears well-developed and well-nourished. No distress.  HENT:  Head: Normocephalic and atraumatic.  Right Ear: External ear normal.  Left Ear: External ear normal.  Mouth/Throat: Oropharynx is clear and moist. No oropharyngeal exudate.  Eyes: Conjunctivae and EOM are normal. Pupils are equal, round, and reactive to light. Right eye exhibits no discharge. Left eye exhibits no discharge. No scleral icterus.  Neck: Normal range of motion. Neck supple. No JVD present. No tracheal deviation present. No thyromegaly present.  Cardiovascular: Normal rate, regular rhythm, normal heart sounds and intact distal pulses.  Exam reveals no gallop and no friction rub.   No murmur heard. Pulmonary/Chest: Effort normal and breath sounds normal. No respiratory distress. She has no wheezes. She has no rales. She exhibits no tenderness.  Abdominal: Soft. Bowel sounds are normal. She exhibits no distension and no mass. There is no tenderness. There is no rebound and no guarding.  Musculoskeletal: Normal range of motion. She exhibits no edema or tenderness.  Lymphadenopathy:    She has no cervical adenopathy.  Neurological: She is alert and oriented to person, place, and time. She has normal reflexes. No cranial nerve deficit. She exhibits normal muscle tone. Coordination normal.  Skin: Skin is warm and dry. No rash noted. She is not diaphoretic. No erythema. No pallor.  Psychiatric: She has a normal mood and affect. Her behavior is normal. Judgment and thought content normal.   Vitals reviewed.   Vitals:   04/08/17 1625  BP: 122/78  Pulse: 98  Resp: 14  SpO2: 98%  Weight: 142 lb 3.2 oz (64.5 kg)  Height: _0  (1.626 m)    Estimated body mass index is 24.41 kg/m as calculated from the following:   Height as of this encounter: _1  (1.626 m).   Weight as of this encounter: 142 lb 3.2 oz (64.5 kg).       Assessment:       ICD-9-CM ICD-10-CM   1. Other pulmonary embolism with acute cor pulmonale, unspecified chronicity (HCC) 415.19 I26.09 D-Dimer, Quantitative   415.0    2. 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 Brand Males, M.D., F.C.C.P Pulmonary and Critical Care Medicine Staff Physician [place] [facility] Pager: [phone], If no answer or between  15:00h - 7:00h: call [phone]  04/08/2017 5:25 PM

## 2017-04-08 NOTE — Assessment & Plan Note (Signed)
Likely benign per radiology spring 2017  Plan Repeat ct chest with contrast in spring 2019

## 2017-04-08 NOTE — Assessment & Plan Note (Signed)
Do d-dimer blood work 04/08/2017  Plan Will call with results; if normal we can do baby aspirin If high, we can do half dose xarelto which is  per day Will see if there is coupon for xarelto

## 2017-04-09 LAB — D-DIMER, QUANTITATIVE (NOT AT ARMC): D DIMER QUANT: 0.21 ug{FEU}/mL (ref <0.50)

## 2017-04-11 ENCOUNTER — Telehealth: Payer: Self-pay | Admitting: Internal Medicine

## 2017-04-11 DIAGNOSIS — J9859 Other diseases of mediastinum, not elsewhere classified: Secondary | ICD-10-CM

## 2017-04-11 NOTE — Telephone Encounter (Signed)
D-dimer normal. Risk for recurrence of PE is very low (but not zero).   Plan - can stop xarelto and change to baby aspirin  per day  -rov spring 2019 after ct chest for mediastinal mass  Dr. Kalman Shan, M.D., [facility].C.P Pulmonary and Critical Care Medicine Staff Physician [facility] Pager: [phone], If no answer or between  15:00h - 7:00h: call [phone]  04/11/2017 11:23 PM

## 2017-04-12 NOTE — Telephone Encounter (Signed)
Called and spoke to pt. Informed her of the results and recs per MR. Pt verbalized understanding and denied any further questions or concerns at this time.   

## 2018-02-20 ENCOUNTER — Other Ambulatory Visit: Payer: Self-pay | Admitting: *Deleted

## 2018-02-20 DIAGNOSIS — J9859 Other diseases of mediastinum, not elsewhere classified: Secondary | ICD-10-CM

## 2018-02-20 NOTE — Progress Notes (Signed)
Placed labwork due to pt having ct with contrast 03/23/18 and this lab needs to be done before that.

## 2018-03-20 ENCOUNTER — Other Ambulatory Visit (INDEPENDENT_AMBULATORY_CARE_PROVIDER_SITE_OTHER): Payer: Medicare Other

## 2018-03-20 DIAGNOSIS — J9859 Other diseases of mediastinum, not elsewhere classified: Secondary | ICD-10-CM | POA: Diagnosis not present

## 2018-03-20 LAB — BASIC METABOLIC PANEL
BUN: 20 mg/dL (ref 6–23)
CHLORIDE: 102 meq/L (ref 96–112)
CO2: 28 mEq/L (ref 19–32)
CREATININE: 0.78 mg/dL (ref 0.40–1.20)
Calcium: 9.7 mg/dL (ref 8.4–10.5)
GFR: 78.76 mL/min (ref >60.00)
GLUCOSE: 225 mg/dL — AB (ref 70–99)
Potassium: 4.5 mEq/L (ref 3.5–5.1)
Sodium: 138 mEq/L (ref 135–145)

## 2018-03-23 ENCOUNTER — Other Ambulatory Visit: Payer: BLUE CROSS/BLUE SHIELD

## 2018-03-27 ENCOUNTER — Ambulatory Visit (INDEPENDENT_AMBULATORY_CARE_PROVIDER_SITE_OTHER)
Admission: RE | Admit: 2018-03-27 | Discharge: 2018-03-27 | Disposition: A | Discharge status: Home / Self Care | Payer: Medicare Other | Source: Ambulatory Visit | Attending: Internal Medicine | Admitting: Internal Medicine

## 2018-03-27 DIAGNOSIS — J9859 Other diseases of mediastinum, not elsewhere classified: Secondary | ICD-10-CM

## 2018-03-27 MED ORDER — IOPAMIDOL (ISOVUE-300) INJECTION 61%
80.0000 mL | Freq: Once | INTRAVENOUS | Status: AC | PRN
  Administered 2018-03-27: 80 mL via INTRAVENOUS

## 2018-04-13 ENCOUNTER — Encounter: Payer: Self-pay | Admitting: Internal Medicine

## 2018-04-13 ENCOUNTER — Ambulatory Visit (INDEPENDENT_AMBULATORY_CARE_PROVIDER_SITE_OTHER): Payer: Medicare Other | Admitting: Internal Medicine

## 2018-04-13 VITALS — BP 106/70 | HR 89 | Ht 64.0 in | Wt 144.8 lb

## 2018-04-13 DIAGNOSIS — Z86711 Personal history of pulmonary embolism: Secondary | ICD-10-CM

## 2018-04-13 DIAGNOSIS — J9859 Other diseases of mediastinum, not elsewhere classified: Secondary | ICD-10-CM | POA: Diagnosis not present

## 2018-04-13 DIAGNOSIS — R0602 Shortness of breath: Secondary | ICD-10-CM | POA: Diagnosis not present

## 2018-04-13 DIAGNOSIS — R911 Solitary pulmonary nodule: Secondary | ICD-10-CM

## 2018-04-13 NOTE — Progress Notes (Signed)
Subjective:     Patient ID: Kristen Contreras, female   DOB: 02/08/1951, 67 y.o.   MRN: [id number]  HPI  OV 09/10/2015  Chief Complaint  Patient presents with  . Follow-up    Pt here after CT chest. Pt denies any changed in her breathing. Pt denies cough, wheezing and CP/tightness.    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 - has completed 3 months of xarelto. Denies dyspnea or chest pain. CT scan of the chest mid-September 2016 pulmonary embolism was resolved. Feels well. No trauma.  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 CT scan of the chest September 2016 shows these are stable. Radiology is recommending serial follow-up in another 6 months  #New findings that I incidental on CT scan of the chest to hiatal hernia. She tells me that she knows about this. She had severe symptoms of dysphagia and acid reflux several years ago but currently is asymptomatic. She does not have a gastroenterologist. Other issue is right lobe thyroid gland nodule. She is not aware of this. I lasted follow these up with a primary care physician   CT chest   IMPRESSION: 1. Stable last lower lobe pulmonary nodule. Recommend follow-up CT and in 6 months from today's month per Fleischner criteria. 2. 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 Recommend attention on follow-up at time of pulmonary nodule follow-up as above. 3. Clearing of pulmonary emboli and resolution of pleural fluid. 4. Nodularity of the RIGHT lobe of thyroid gland. Recommend ultrasound for evaluation. 5. 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 6. Moderate hiatal hernia.   Electronically Signed  By: Suzy Bouchard M.D.  On: 09/05/2015 14:16      OV 03/22/2016  Chief Complaint  Patient presents with  . Follow-up    Pt here after CT chest. Pt states her breathing is doing well. Pt denies SOB, cough, wheezing,  CP/tightness, and wheezing.    FU PE - 10 mnths xarelto cmpleted. ASymptomatic from PE and bleeding standpoint., FEels real well. 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 My persnal visualization - looks stable.      OV 04/08/2017  Chief Complaint  Patient presents with  . Follow-up    one year follow up  - pt denies CP/tightness, SOB, cough, and f/c/s.    Follow-up pulmonary embolism from June 2016. She continues to be on full dose Xarelto. She feels really well. Asymptomatic. No bleeding complications or respiratory issues.  Follow-up left lower lobe nodule and mediastinal mass: Last CT was in spring 2017:2 years CT was recommended for spring 2019.   OV 04/13/2018  Chief Complaint  Patient presents with  . Follow-up    CT 03/27/18.  Pt states she has been doing well since last visit.    Follow-up  Pulmonary embolism June 2016: She took full dose Xarelto through April 2018.  After that d-dimer was normal and we have her on baby aspirin.  At this point in time she continues to do well without any dyspnea or bleeding complications or respiratory issues  Follow-up left lower lobe lung nodule: She had one-year follow-up CT chest April 2019 I personally visualized this.  There is a small growth the nodule is now grown by 1 mm.  Follow-up mediastinal mass: On the April 2019 CT  chest the mediastinal mass has reduced   IMPRESSION: 1. Interval gradual enlargement of LEFT lower lobe pulmonary nodule. Recommend either follow-up CT in 6-12 months versus biopsy. Biopsy of this small nodule may be difficult at the lung bases. 2. Stable benign-appearing anterior mediastinal ovoid lesion.   Electronically Signed   By: Suzy Bouchard M.D.   On: 03/27/2018 13:47    has a past medical history of GERD (gastroesophageal reflux disease), Pulmonary embolism ([place]) (05/24/2015), and Type II  diabetes mellitus ([place]) (dx'd 05/2015).   reports that she has never smoked. She has never used smokeless tobacco.  Past Surgical History:  Procedure Laterality Date  . CESAREAN SECTION  1990  . TONSILLECTOMY  1959    Allergies  Allergen Reactions  . Penicillins Itching and Rash    Immunization History  Administered Date(s) Administered  . Influenza Inj Mdck Quad Pf 10/17/2017  . Influenza,inj,Quad PF,6+ Mos 09/10/2015, 09/19/2016  . Pneumococcal Polysaccharide-23 05/27/2015  . Tdap 07/03/2015    Family History  Problem Relation Age of Onset  . Heart attack Father 51  . Heart attack Mother 62     Current Outpatient Medications:  .  aspirin 81 MG chewable tablet, Chew 81 mg by mouth daily., Disp: , Rfl:  .  glucose monitoring kit (FREESTYLE) monitoring kit, 1 each by Does not apply route as needed for other., Disp: 1 each, Rfl: 11 .  HUMALOG KWIKPEN 100 UNIT/ML KiwkPen, , Disp: , Rfl:  .  Insulin Pen Needle (PEN NEEDLES 3/16") 31G X 5 MM MISC, 100, Disp: 100 each, Rfl: 1 .  LANTUS SOLOSTAR 100 UNIT/ML Solostar Pen, , Disp: , Rfl:  .  metFORMIN (GLUCOPHAGE) 1000 MG tablet, 1 tablet with meals, Disp: , Rfl:  .  pantoprazole (PROTONIX) 40 MG tablet, Take 1 tablet (40 mg total) by mouth daily., Disp: 30 tablet, Rfl: 2 .  sitaGLIPtin (JANUVIA) 100 MG tablet, Take 100 mg by mouth daily., Disp: , Rfl:     Review of Systems     Objective:   Physical Exam  Constitutional: She is oriented to person, place, and time. She appears well-developed and well-nourished. No distress.  HENT:  Head: Normocephalic and atraumatic.  Right Ear: External ear normal.  Left Ear: External ear normal.  Mouth/Throat: Oropharynx is clear and moist. No oropharyngeal exudate.  Eyes: Pupils are equal, round, and reactive to light. Conjunctivae and EOM are normal. Right eye exhibits no discharge. Left eye exhibits no discharge. No scleral icterus.  Neck: Normal range of motion. Neck supple. No JVD  present. No tracheal deviation present. No thyromegaly present.  Cardiovascular: Normal rate, regular rhythm, normal heart sounds and intact distal pulses. Exam reveals no gallop and no friction rub.  No murmur heard. Pulmonary/Chest: Effort normal and breath sounds normal. No respiratory distress. She has no wheezes. She has no rales. She exhibits no tenderness.  Abdominal: Soft. Bowel sounds are normal. She exhibits no distension and no mass. There is no tenderness. There is no rebound and no guarding.  Musculoskeletal: Normal range of motion. She exhibits no edema or tenderness.  Lymphadenopathy:    She has no cervical adenopathy.  Neurological: She is alert and oriented to person, place, and time. She has normal reflexes. No cranial nerve deficit. She exhibits normal muscle tone. Coordination normal.  Skin: Skin is warm and dry. No rash noted. She is not diaphoretic. No erythema. No pallor.  Psychiatric: She has a normal mood and affect. Her behavior is normal. Judgment and thought  content normal.  Vitals reviewed.  Vitals:   04/13/18 1452  BP: 106/70  Pulse: 89  SpO2: 94%  Weight: 144 lb 12.8 oz (65.7 kg)  Height: _0  (1.626 m)    Estimated body mass index is 24.85 kg/m as calculated from the following:   Height as of this encounter: _1  (1.626 m).   Weight as of this encounter: 144 lb 12.8 oz (65.7 kg).     Assessment:       ICD-10-CM   1. Nodule of lower lobe of left lung R91.1   2. Mediastinal mass J98.59   3. Personal history of PE (pulmonary embolism) Z86.711        Plan:     Nodule of lower lobe of left lung - slight growth 2017  -> 2019  - do CT chest without contrast at [facility] in 9 months (she is reluctant to do CT in 6 months due to radioation risk)  Mediastinal mass - improved on cT April 2019  Personal history of PE (pulmonary embolism) 2016 - s/p xarelto 2016- 2018 - doing well 1 year on aspiriin; continue same  Followup 9 months but after  CT at [facility]   Dr. Brand Males, M.D., [facility].C.P Pulmonary and Critical Care Medicine Staff Physician, [place] Director - Interstitial Lung Disease  Program  Pulmonary [place] at [place], [place], [address]  Pager: [phone], If no answer or between  15:00h - 7:00h: call [phone] Telephone: [phone]

## 2018-04-13 NOTE — Patient Instructions (Signed)
Nodule of lower lobe of left lung - slight growth 2017  -> 2019  - [facility]  Mediastinal mass - improved on cT April 2019  Personal history of PE (pulmonary embolism) 2016 - s/p xarelto 2016- 2018 - doing well 1 year on aspiriin; continue same  Followup 9 months but after [facility]

## 2019-01-16 ENCOUNTER — Ambulatory Visit (HOSPITAL_COMMUNITY)
Admission: RE | Admit: 2019-01-16 | Discharge: 2019-01-16 | Disposition: A | Discharge status: Home / Self Care | Payer: Medicare Other | Source: Ambulatory Visit | Attending: Internal Medicine | Admitting: Internal Medicine

## 2019-01-16 DIAGNOSIS — R0602 Shortness of breath: Secondary | ICD-10-CM | POA: Insufficient documentation

## 2020-01-16 LAB — HEMOGLOBIN A1C: Hemoglobin A1C: 7.9

## 2020-01-16 LAB — LIPID PANEL
Cholesterol: 148 (ref 0–200)
HDL: 98 — AB (ref 35–70)
LDL Cholesterol: 40
Triglycerides: 58 (ref 40–160)

## 2020-01-16 LAB — BASIC METABOLIC PANEL
BUN: 16 (ref 4–21)
Creatinine: 0.7 (ref 0.5–1.1)

## 2020-02-15 ENCOUNTER — Telehealth: Payer: Self-pay | Admitting: Internal Medicine

## 2020-02-15 NOTE — Telephone Encounter (Signed)
Attempted to call pt but unable to reach. Left message for pt to return call. 

## 2020-02-15 NOTE — Telephone Encounter (Signed)
Last seen nearly 2 years ago  Last CT chest jan 2020 showed stable nodule but needs followup  Plan  - get CT chest wo contrast done asap Kristen Contreras lives in [facility] [place] [address])  - see me in office first avaialble but above CT needs to be done asap  Thanks  IMPRESSION: 1. Stable 8 x 6 mm left lower lobe pulmonary nodule. Other smaller pulmonary nodules are also stable. No new pulmonary lesions. 2. Nodular airspace process in the right middle lobe most likely early or clearing bronchopneumonia or other inflammatory process. Recommend correlation with any clinical symptoms. A short-term follow-up post treatment chest CT in 3-4 months may be helpful to document resolution. 3. Stable anterior mediastinal fluid collection. No mediastinal or hilar mass or adenopathy. 4. Stable moderate to large hiatal hernia.  Aortic Atherosclerosis (ICD10-I70.0).   Electronically Signed   By: Rudie Meyer M.D.   On: 01/16/2019 14:35   SIGNATURE    Dr. Kalman Shan, M.D., F.C.C.P,  Pulmonary and Critical Care Medicine Staff Physician, [facility] Director - Interstitial Lung Disease  Program  Pulmonary Fibrosis [facility] [place], [place], [address]  Pager: [phone], If no answer or between  15:00h - 7:00h: call [phone] Telephone: [phone]  3:05 AM 02/15/2020

## 2020-02-19 NOTE — Telephone Encounter (Signed)
Attempted to call pt x2 but unable to reach. Left message for pt to return call. 

## 2020-02-22 ENCOUNTER — Telehealth: Payer: Self-pay | Admitting: Internal Medicine

## 2020-02-22 DIAGNOSIS — R918 Other nonspecific abnormal finding of lung field: Secondary | ICD-10-CM

## 2020-02-22 NOTE — Telephone Encounter (Signed)
Attempted to call pt x3 but unable to reach. Due to multiple attempts trying to contact pt with unable to do so, letter will be sent to pt and encounter will be closed.

## 2020-02-22 NOTE — Telephone Encounter (Signed)
Message from closed encounter posted below: Kalman Shan, MD     3:06 AM Note   Last seen nearly 2 years ago  Last CT chest jan 2020 showed stable nodule but needs followup  Plan  - get CT chest wo contrast done asap SYLVER VANTASSELL lives in [facility] [place] [address])  - see me in office first avaialble but above CT needs to be done asap  Thanks  IMPRESSION: 1. Stable 8 x 6 mm left lower lobe pulmonary nodule. Other smaller pulmonary nodules are also stable. No new pulmonary lesions. 2. Nodular airspace process in the right middle lobe most likely early or clearing bronchopneumonia or other inflammatory process. Recommend correlation with any clinical symptoms. A short-term follow-up post treatment chest CT in 3-4 months may be helpful to document resolution. 3. Stable anterior mediastinal fluid collection. No mediastinal or hilar mass or adenopathy. 4. Stable moderate to large hiatal hernia.  Aortic Atherosclerosis (ICD10-I70.0).   Electronically Signed By: Rudie Meyer M.D. On: 01/16/2019 14:35     Called and spoke with pt letting her know the info stated by MR that we need to repeat CT to follow up on nodule and she verbalized understanding. Also stated to her that we needed to make an appt with MR to go over the results with her after the CT. Order placed for the CT and pt scheduled for an appt with MR 3/12. Nothing further needed.

## 2020-03-10 ENCOUNTER — Telehealth: Payer: Self-pay | Admitting: Internal Medicine

## 2020-03-10 NOTE — Telephone Encounter (Signed)
Routing to PCCs to follow up on.

## 2020-03-11 ENCOUNTER — Other Ambulatory Visit: Payer: Self-pay

## 2020-03-11 ENCOUNTER — Encounter: Payer: Self-pay | Admitting: "Endocrinology

## 2020-03-11 ENCOUNTER — Ambulatory Visit (INDEPENDENT_AMBULATORY_CARE_PROVIDER_SITE_OTHER): Payer: Medicare Other | Admitting: "Endocrinology

## 2020-03-11 VITALS — BP 145/86 | HR 102 | Ht 64.0 in | Wt 153.6 lb

## 2020-03-11 DIAGNOSIS — E1165 Type 2 diabetes mellitus with hyperglycemia: Secondary | ICD-10-CM

## 2020-03-11 DIAGNOSIS — I10 Essential (primary) hypertension: Secondary | ICD-10-CM | POA: Insufficient documentation

## 2020-03-11 DIAGNOSIS — E782 Mixed hyperlipidemia: Secondary | ICD-10-CM | POA: Insufficient documentation

## 2020-03-11 MED ORDER — LANTUS SOLOSTAR 100 UNIT/ML ~~LOC~~ SOPN: 20.0000 [IU] | PEN_INJECTOR | Freq: Every day | SUBCUTANEOUS | 2 refills | Status: AC

## 2020-03-11 NOTE — Telephone Encounter (Signed)
lmtcb with pt Kristen Contreras ° °

## 2020-03-11 NOTE — Progress Notes (Signed)
Endocrinology Consult Note       03/11/2020, 8:36 PM   Subjective:    Patient ID: Kristen Contreras, female    DOB: 04-07-1951.  Kristen Contreras is being seen in consultation for management of currently uncontrolled symptomatic diabetes requested by  Sherrilee Gilles, DO.   Past Medical History:  Diagnosis Date  . GERD (gastroesophageal reflux disease)   . Pulmonary embolism ([place]) 05/24/2015  . Type II diabetes mellitus ([place]) dx'd 05/2015    Past Surgical History:  Procedure Laterality Date  . CESAREAN SECTION  1990  . TONSILLECTOMY  1959    Social History   Socioeconomic History  . Marital status: Married    Spouse name: Not on file  . Number of children: Not on file  . Years of education: Not on file  . Highest education level: Not on file  Occupational History  . Not on file  Tobacco Use  . Smoking status: Never Smoker  . Smokeless tobacco: Never Used  Substance and Sexual Activity  . Alcohol use: No    Alcohol/week: 0.0 standard drinks  . Drug use: No  . Sexual activity: Yes  Other Topics Concern  . Not on file  Social History Narrative  . Not on file   Social Determinants of Health   Financial Resource Strain:   . Difficulty of Paying Living Expenses:   Food Insecurity:   . Worried About Charity fundraiser in the Last Year:   . Arboriculturist in the Last Year:   Transportation Needs:   . Film/video editor (Medical):   Marland Kitchen Lack of Transportation (Non-Medical):   Physical Activity:   . Days of Exercise per Week:   . Minutes of Exercise per Session:   Stress:   . Feeling of Stress :   Social Connections:   . Frequency of Communication with Friends and Family:   . Frequency of Social Gatherings with Friends and Family:   . Attends Religious Services:   . Active Member of Clubs or Organizations:   . Attends Archivist Meetings:   Marland Kitchen Marital Status:     Family  History  Problem Relation Age of Onset  . Heart attack Father 62  . Heart disease Father   . Heart attack Mother 67  . Hypertension Mother     Outpatient Encounter Medications as of 03/11/2020  Medication Sig  . Multiple Vitamin (MULTIVITAMIN ADULT PO) Take by mouth.  . Potassium 99 MG TABS Take 1 tablet by mouth daily.  Marland Kitchen aspirin 81 MG chewable tablet Chew 81 mg by mouth daily.  Marland Kitchen glucose monitoring kit (FREESTYLE) monitoring kit 1 each by Does not apply route as needed for other.  Marland Kitchen HUMALOG KWIKPEN 100 UNIT/ML KiwkPen Inject 1 Units into the skin as directed.  . Insulin Pen Needle (PEN NEEDLES 3/16") 31G X 5 MM MISC 100  . JANUMET 50-1000 MG tablet Take 1 tablet by mouth 2 (two) times daily.  Marland Kitchen LANTUS SOLOSTAR 100 UNIT/ML Solostar Pen Inject 20 Units into the skin at bedtime.  Marland Kitchen lisinopril (ZESTRIL) 2.5 MG tablet Take 2.5 mg by mouth  daily.  . pantoprazole (PROTONIX) 40 MG tablet Take 1 tablet (40 mg total) by mouth daily.  . rosuvastatin (CRESTOR) 10 MG tablet Take 10 mg by mouth at bedtime.  . [DISCONTINUED] LANTUS SOLOSTAR 100 UNIT/ML Solostar Pen Inject 20 Units into the skin at bedtime.  . [DISCONTINUED] metFORMIN (GLUCOPHAGE) 1000 MG tablet 1 tablet with meals  . [DISCONTINUED] sitaGLIPtin (JANUVIA) 100 MG tablet Take 100 mg by mouth daily.   No facility-administered encounter medications on file as of 03/11/2020.    ALLERGIES: Allergies  Allergen Reactions  . Penicillins Itching and Rash    VACCINATION STATUS: Immunization History  Administered Date(s) Administered  . Influenza Inj Mdck Quad Pf 10/17/2017  . Influenza,inj,Quad PF,6+ Mos 09/10/2015, 09/19/2016  . Pneumococcal Polysaccharide-23 05/27/2015  . Tdap 07/03/2015    Diabetes She presents for her initial diabetic visit. She has type 2 diabetes mellitus. Onset time: She was diagnosed at approximate age of 69 years. Her disease course has been fluctuating. There are no hypoglycemic associated symptoms.  Pertinent negatives for hypoglycemia include no confusion, headaches, pallor or seizures. Associated symptoms include fatigue, polyphagia and polyuria. Pertinent negatives for diabetes include no chest pain and no polydipsia. There are no hypoglycemic complications. Symptoms are worsening. There are no diabetic complications. Risk factors for coronary artery disease include diabetes mellitus, dyslipidemia, family history, hypertension, post-menopausal and sedentary lifestyle. Current diabetic treatment includes insulin injections (She is currently on Lantus 16 units nightly, Humalog 4-8 units 3 times daily AC.  She is also on Janumet 50/1000 mg p.o. twice daily.). Her weight is increasing steadily. She is following a generally unhealthy diet. When asked about meal planning, she reported none. She has not had a previous visit with a dietitian. She rarely participates in exercise. Her breakfast blood glucose range is generally 140-180 mg/dl. Her lunch blood glucose range is generally 140-180 mg/dl. Her dinner blood glucose range is generally 140-180 mg/dl. Her bedtime blood glucose range is generally 180-200 mg/dl. Her overall blood glucose range is 140-180 mg/dl. (She brought a log sheet showing average blood glucose between 100-150, recent A1c of 7.9%.  She did not document any major hypoglycemia.) An ACE inhibitor/angiotensin II receptor blocker is being taken. Eye exam is current.  Hyperlipidemia This is a chronic problem. The current episode started more than 1 year ago. Exacerbating diseases include diabetes. Pertinent negatives include no chest pain, myalgias or shortness of breath. Current antihyperlipidemic treatment includes statins. Risk factors for coronary artery disease include family history, dyslipidemia, diabetes mellitus, a sedentary lifestyle and post-menopausal.  Hypertension This is a chronic problem. The current episode started more than 1 year ago. Pertinent negatives include no chest pain,  headaches, palpitations or shortness of breath. Risk factors for coronary artery disease include diabetes mellitus, dyslipidemia, post-menopausal state, sedentary lifestyle and family history. Past treatments include ACE inhibitors.     Review of Systems  Constitutional: Positive for fatigue. Negative for chills, fever and unexpected weight change.  HENT: Negative for trouble swallowing and voice change.   Eyes: Negative for visual disturbance.  Respiratory: Negative for cough, shortness of breath and wheezing.   Cardiovascular: Negative for chest pain, palpitations and leg swelling.  Gastrointestinal: Negative for diarrhea, nausea and vomiting.  Endocrine: Positive for polyphagia and polyuria. Negative for cold intolerance, heat intolerance and polydipsia.  Musculoskeletal: Negative for arthralgias and myalgias.  Skin: Negative for color change, pallor, rash and wound.  Neurological: Negative for seizures and headaches.  Psychiatric/Behavioral: Negative for confusion and suicidal ideas.  Objective:    Vitals with BMI 03/11/2020 04/13/2018 04/08/2017  Height 5' 4"  5' 4"  5' 4"   Weight 153 lbs 10 oz 144 lbs 13 oz 142 lbs 3 oz  BMI 26.35 69.48 54.6  Systolic 270 350 093  Diastolic 86 70 78  Pulse 818 89 98    BP (!) 145/86   Pulse (!) 102   Ht 5' 4"  (1.626 m)   Wt 153 lb 9.6 oz (69.7 kg)   BMI 26.37 kg/m   Wt Readings from Last 3 Encounters:  03/11/20 153 lb 9.6 oz (69.7 kg)  04/13/18 144 lb 12.8 oz (65.7 kg)  04/08/17 142 lb 3.2 oz (64.5 kg)     Physical Exam Constitutional:      Appearance: She is well-developed.  HENT:     Head: Normocephalic and atraumatic.  Neck:     Thyroid: No thyromegaly.     Trachea: No tracheal deviation.  Cardiovascular:     Rate and Rhythm: Normal rate and regular rhythm.  Pulmonary:     Effort: Pulmonary effort is normal.     Breath sounds: Normal breath sounds.  Abdominal:     General: Bowel sounds are normal.     Palpations: Abdomen  is soft.     Tenderness: There is no abdominal tenderness. There is no guarding.  Musculoskeletal:        General: Normal range of motion.     Cervical back: Normal range of motion and neck supple.  Skin:    General: Skin is warm and dry.     Coloration: Skin is not pale.     Findings: No erythema or rash.  Neurological:     Mental Status: She is alert and oriented to person, place, and time.     Cranial Nerves: No cranial nerve deficit.     Coordination: Coordination normal.     Deep Tendon Reflexes: Reflexes are normal and symmetric.  Psychiatric:        Judgment: Judgment normal.     CMP ( most recent) CMP     Component Value Date/Time   NA 138 03/20/2018 1100   K 4.5 03/20/2018 1100   CL 102 03/20/2018 1100   CO2 28 03/20/2018 1100   GLUCOSE 225 (H) 03/20/2018 1100   BUN 16 01/16/2020 0000   CREATININE 0.7 01/16/2020 0000   CREATININE 0.78 03/20/2018 1100   CALCIUM 9.7 03/20/2018 1100   PROT 5.1 (L) 05/24/2015 2030   ALBUMIN 2.7 (L) 05/24/2015 2030   AST 19 05/24/2015 2030   ALT 14 05/24/2015 2030   ALKPHOS 91 05/24/2015 2030   BILITOT 0.5 05/24/2015 2030   GFRNONAA >60 05/28/2015 0534   GFRAA >60 05/28/2015 0534     Diabetic Labs (most recent): Lab Results  Component Value Date   HGBA1C 7.9 01/16/2020   HGBA1C 11.5 (H) 05/24/2015        Assessment & Plan:   1. Uncontrolled type 2 diabetes mellitus with hyperglycemia ([place])   - SHARAINE DELANGE has currently uncontrolled symptomatic type 2 DM since  69 years of age,  with most recent A1c of 7.9 %. Recent labs reviewed. - I had a long discussion with her about the progressive nature of diabetes and the pathology behind its complications. She did not report any gross complications from her diabetes, however she remains at a high risk for more acute and chronic complications which include CAD, CVA, CKD, retinopathy, and neuropathy. These are all discussed in detail with her.  -  I have counseled her on diet   and weight management  by adopting a carbohydrate restricted/protein rich diet. Patient is encouraged to switch to  unprocessed or minimally processed     complex starch and increased protein intake (animal or plant source), fruits, and vegetables. -  she is advised to stick to a routine mealtimes to eat 3 meals  a day and avoid unnecessary snacks ( to snack only to correct hypoglycemia).   - she admits that there is a room for improvement in her food and drink choices. - Suggestion is made for her to avoid simple carbohydrates  from her diet including Cakes, Sweet Desserts, Ice Cream, Soda (diet and regular), Sweet Tea, Candies, Chips, Cookies, Store Bought Juices, Alcohol in Excess of  1-2 drinks a day, Artificial Sweeteners,  Coffee Creamer, and "Sugar-free" Products. This will help patient to have more stable blood glucose profile and potentially avoid unintended weight gain.  - she will be scheduled with Jearld Fenton, RDN, CDE for diabetes education.  - I have approached her with the following individualized plan to manage  her diabetes and patient agrees:   -Given relatively recent onset of her diabetes and her requirement for minimal dose insulin, she would benefit from simplified treatment regimen. -I discussed and increased her Lantus to 20 units nightly, advised her to hold her Humalog for now.  She is advised to continue monitoring blood glucose at least twice a day-daily before breakfast and at bedtime. - she is encouraged to call clinic for blood glucose levels less than 70 or above 200 mg /dl. - she is advised to continue Janumet 50/1000 mg p.o. twice daily, therapeutically suitable for patient .   - Specific targets for  A1c;  LDL, HDL,  and Triglycerides were discussed with the patient.  2) Blood Pressure /Hypertension:  her blood pressure is not controlled to target.   she is advised to continue her current medications including lisinopril 2.5 mg p.o. daily with breakfast . 3)  Lipids/Hyperlipidemia:   Review of her recent lipid panel showed  controlled  LDL at 40 .  she  is advised to continue    Crestor 10 mg daily at bedtime.  Side effects and precautions discussed with her.  4)  Weight/Diet:  Body mass index is 26.37 kg/m.  - she is not a candidate for weight loss. I discussed with her the fact that loss of 5 - 10% of her  current body weight will have the most impact on her diabetes management.  Exercise, and detailed carbohydrates information provided  -  detailed on discharge instructions.  5) Chronic Care/Health Maintenance:  -she  is on ACEI/ARB and Statin medications and  is encouraged to initiate and continue to follow up with Ophthalmology, Dentist,  Podiatrist at least yearly or according to recommendations, and advised to   stay away from smoking. I have recommended yearly flu vaccine and pneumonia vaccine at least every 5 years; moderate intensity exercise for up to 150 minutes weekly; and  sleep for at least 7 hours a day.  - she is  advised to maintain close follow up with Sherrilee Gilles, DO for primary care needs, as well as her other providers for optimal and coordinated care.   - Time spent in this patient care: 60 min, of which > 50% was spent in  counseling  her about her currently uncontrolled type 2 diabetes, hyperlipidemia, hypertension and the rest reviewing her blood glucose logs , discussing her hypoglycemia and hyperglycemia  episodes, reviewing her current and  previous labs / studies  ( including abstraction from other facilities) and medications  doses and developing a  long term treatment plan based on the latest standards of care/ guidelines; and documenting her care.    Please refer to Patient Instructions for Blood Glucose Monitoring and Insulin/Medications Dosing Guide"  in media tab for additional information. Please  also refer to " Patient Self Inventory" in the Media  tab for reviewed elements of pertinent patient history.  Marlowe Sax participated in the discussions, expressed understanding, and voiced agreement with the above plans.  All questions were answered to her satisfaction. she is encouraged to contact clinic should she have any questions or concerns prior to her return visit.   Follow up plan: - Return in about 5 weeks (around 04/15/2020) for Bring Meter and Logs- A1c in Office.  Glade Lloyd, MD [facility] [address] [place], [place] [address] Phone: [phone]  Fax: [phone]    03/11/2020, 8:36 PM  This note was partially dictated with voice recognition software. Similar sounding words can be transcribed inadequately or may not  be corrected upon review.

## 2020-03-11 NOTE — Patient Instructions (Signed)

## 2020-03-12 NOTE — Telephone Encounter (Signed)
Spoke to pt and insurance there was a precert done [id number] valid dates 03/24/20-04/23/20 pt is aware Kristen Contreras

## 2020-03-24 ENCOUNTER — Ambulatory Visit (HOSPITAL_COMMUNITY): Payer: Medicare Other

## 2020-03-27 ENCOUNTER — Telehealth: Payer: Self-pay | Admitting: Internal Medicine

## 2020-03-27 NOTE — Telephone Encounter (Addendum)
Called & spoke to pt.  Original CT was canceled due to no precert.  I told her I will call and get rescheduled and call her back.  Does not want on 4/23 or 4/28.  I checked referral & Berkley Harvey was received.  Good for 4/5-5/5.  Rescheduled CT for 4/13 at 6:00.  Gave appt info to pt.  Nothing further needed.

## 2020-03-27 NOTE — Telephone Encounter (Signed)
Pt cancelled CT that was scheduled at [facility] for 03/24/20. PT would like to reschedule this. Sending to [facility] to get this scheduled.

## 2020-03-31 ENCOUNTER — Ambulatory Visit: Payer: Medicare Other | Admitting: Internal Medicine

## 2020-04-01 ENCOUNTER — Ambulatory Visit (HOSPITAL_COMMUNITY)
Admission: RE | Admit: 2020-04-01 | Discharge: 2020-04-01 | Disposition: A | Discharge status: Home / Self Care | Payer: Medicare Other | Source: Ambulatory Visit | Attending: Internal Medicine | Admitting: Internal Medicine

## 2020-04-01 ENCOUNTER — Other Ambulatory Visit: Payer: Self-pay

## 2020-04-01 DIAGNOSIS — R918 Other nonspecific abnormal finding of lung field: Secondary | ICD-10-CM | POA: Insufficient documentation

## 2020-04-15 ENCOUNTER — Ambulatory Visit: Payer: Medicare Other | Admitting: "Endocrinology

## 2020-04-21 ENCOUNTER — Ambulatory Visit: Payer: Medicare Other | Admitting: Nutrition

## 2020-04-30 ENCOUNTER — Telehealth: Payer: Self-pay | Admitting: Internal Medicine

## 2020-04-30 NOTE — Telephone Encounter (Signed)
  CT results below     IMPRESSION: 1. Interval resolution of previously visualized patchy opacities in the right middle lobe, compatible with resolved infectious or inflammatory process. 2. Scattered left lower lobe solid pulmonary nodules, largest 8 mm, all stable, considered benign. 3. Small to moderate hiatal hernia. 4. Aortic Atherosclerosis (ICD10-I70.0). 5. Hypodense 1.7 cm right thyroid nodule is unchanged. This has been documented on prior studies 6., . 1.8 x 1.1 cm fluid density structure in prevascular left mediastinum (series 8/image 50), unchanged, compatible with a pericardial cyst.  Electronically Signed   By: Delbert Phenix M.D.   On: 04/02/2020 10:09   Plan  -give her video or tele visit to discuss results and wraop up   Thanks    SIGNATURE    Dr. Kalman Shan, M.D., F.C.C.P,  Pulmonary and Critical Care Medicine Staff Physician, [facility] Center Director - Interstitial Lung Disease  Program  Pulmonary Fibrosis [facility] at [facility] [place], [place], [address]  Pager: [phone], If no answer or between  15:00h - 7:00h: call [phone] Telephone: [phone]  3:32 PM 04/30/2020       COMPARISON:  01/16/2019 chest CT.  FINDINGS: Cardiovascular: Normal heart size. No significant pericardial effusion/thickening. Mildly atherosclerotic nonaneurysmal thoracic aorta. Normal caliber pulmonary arteries.  Mediastinum/Nodes: Hypodense 1.7 cm right thyroid nodule is unchanged. This has been documented on prior studies (ref: J Am Coll Radiol. 2015 Feb;12(2): 143-50). Unremarkable esophagus. No pathologically enlarged axillary, mediastinal or hilar lymph nodes, noting limited sensitivity for the detection of hilar adenopathy on this noncontrast study. Coarsely calcified granulomatous left hilar nodes are unchanged. 1.8 x 1.1 cm fluid density structure in prevascular left mediastinum (series 8/image  50), unchanged, compatible with a pericardial cyst.  Lungs/Pleura: No pneumothorax. No pleural effusion. No acute consolidative airspace disease or lung masses. Previously visualized patchy opacities in the right middle lobe is resolved. A few scattered left lower lobe solid pulmonary nodules, largest 8 mm (series 4/image 112), all stable. No new significant pulmonary nodules.  Upper abdomen: Small to moderate hiatal hernia. Granulomatous splenic calcifications are unchanged.  Musculoskeletal: No aggressive appearing focal osseous lesions. Mild thoracic spondylosis.  IMPRESSION: 1. Interval resolution of previously visualized patchy opacities in the right middle lobe, compatible with resolved infectious or inflammatory process. 2. Scattered left lower lobe solid pulmonary nodules, largest 8 mm, all stable, considered benign. 3. Small to moderate hiatal hernia. 4. Aortic Atherosclerosis (ICD10-I70.0). 5. Hypodense 1.7 cm right thyroid nodule is unchanged. This has been documented on prior studies 6., . 1.8 x 1.1 cm fluid density structure in prevascular left mediastinum (series 8/image 50), unchanged, compatible with a pericardial cyst.  Electronically Signed   By: Delbert Phenix M.D.   On: 04/02/2020 10:09

## 2020-05-02 NOTE — Telephone Encounter (Signed)
Called and spoke with pt letting her know that MR wanted [place] to schedule a visit to discuss CT and she verbalized understanding. Pt has been scheduled a virtual visit with Jamey Reas at 3pm. Nothing further needed.

## 2020-05-06 ENCOUNTER — Telehealth: Payer: Medicare Other | Admitting: Pulmonary Disease

## 2020-05-06 NOTE — Progress Notes (Deleted)
05/06/2020 1500 attempted to reach patient left voicemail Patient has not logged on for  video visit

## 2020-05-12 ENCOUNTER — Encounter: Payer: Self-pay | Admitting: Acute Care

## 2020-05-12 ENCOUNTER — Telehealth (INDEPENDENT_AMBULATORY_CARE_PROVIDER_SITE_OTHER): Payer: Medicare Other | Admitting: Acute Care

## 2020-05-12 ENCOUNTER — Ambulatory Visit (INDEPENDENT_AMBULATORY_CARE_PROVIDER_SITE_OTHER): Payer: Medicare Other | Admitting: Acute Care

## 2020-05-12 ENCOUNTER — Ambulatory Visit: Payer: Medicare Other | Admitting: "Endocrinology

## 2020-05-12 DIAGNOSIS — R911 Solitary pulmonary nodule: Secondary | ICD-10-CM

## 2020-05-12 DIAGNOSIS — I2699 Other pulmonary embolism without acute cor pulmonale: Secondary | ICD-10-CM

## 2020-05-12 NOTE — Patient Instructions (Signed)
It was good to talk with you today. Your CT Chest shows a stable pulmonary nodule. Follow up with Dr. Marchelle Gearing in 12 months ( 04/2021) for shared decision regarding need for continued surveillance of stable pulmonary nodule. Congratulations on getting your Covid vaccines.  Follow up 04/2021 with Dr. Marchelle Gearing Please contact office for sooner follow up if symptoms do not improve or worsen or seek emergency care

## 2020-05-12 NOTE — Progress Notes (Signed)
Virtual Visit via Telephone Note  I connected withKristen Contreras on 05/12/20 at 12:00 PM EDT by telephoneand verified that I am speaking with the correct person using two identifiers.  Location: Patient: At home  Provider: Working remotely from home.  I discussed the limitations, risks, security and privacy concerns of performing an evaluation and management service by telephone and the availability of in person appointments. I also discussed with the patient that there may be a patient responsible charge related to this service. The patient expressed understanding and agreed to proceed.   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 She has had serial CT Chest as surveillance. She also has a hiatal hernia. She is followed by Dr. Ramaswamy   History of Present Illness: Pt. Presents for follow up of CT Chest done 04/05/2020 for follow up pulmonary nodule. We discussed that the CT Chest confirms a stable 8 mm nodule. There is no recommendation from Radiology for follow up.I told the patient to have a discussion with Dr. Ramaswamy regarding continued surveillance if any is needed. The patient verbalized understanding of the above. She has been healthy. She has been fully  vaccinated for Covid 19. We will schedule her for a follow up with MR in 1 year ( 04/2021)      Observations/Objective: CT Chest 04/05/2020 No pneumothorax. No pleural effusion. No acute consolidative airspace disease or lung masses. Previously visualized patchy opacities in the right middle lobe is resolved. A few scattered left lower lobe solid pulmonary nodules, largest 8 mm (series 4/image 112), all stable. No new significant pulmonary nodules. Interval resolution of previously visualized patchy opacities  in the right middle lobe, compatible with resolved infectious or inflammatory process. Scattered left lower lobe solid pulmonary nodules, largest 8 mm, all stable, considered benign. Small to moderate hiatal hernia. Aortic Atherosclerosis (ICD10-I70.0).   Assessment and Plan: Pulmonary Nodule Follow up Stable 8mm nodule per CT Plan Follow up with Dr. Ramaswamy in 12 months ( 04/2021) Shared decision regarding continued surveillance of nodule  PE Unprovoked 05/2015 Completed Treatment with Xaralto Plan Continue baby ASA per Dr.Ramaswamy  Follow Up Instructions: Follow up with Dr. Ramaswamy 04/2021  I discussed the assessment and treatment plan with the patient. The patient was provided an opportunity to ask questions and all were answered. The patient agreed with the plan and demonstrated an understanding of the instructions.  The patient was advised to call back or seek an in-person evaluation if the symptoms worsen or if the condition fails to improve as anticipated.  I provided 22 minutes of non-face-to-face time during this encounter.    F , NP 05/12/2020  

## 2020-05-12 NOTE — Progress Notes (Signed)
Virtual Visit via Telephone Note  I connected withDebra S Contreras on 05/12/20 at 12:00 PM EDT by telephoneand verified that I am speaking with the correct person using two identifiers.  Location: Patient: At home  Provider: Working remotely from home.  I discussed the limitations, risks, security and privacy concerns of performing an evaluation and management service by telephone and the availability of in person appointments. I also discussed with the patient that there may be a patient responsible charge related to this service. The patient expressed understanding and agreed to proceed.   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 She has had serial CT Chest as surveillance. She also has a hiatal hernia. She is followed by Dr. Marchelle Gearing   History of Present Illness: Pt. Presents for follow up of CT Chest done 04/05/2020 for follow up pulmonary nodule. We discussed that the CT Chest confirms a stable 8 mm nodule. There is no recommendation from Radiology for follow up.I told the patient to have a discussion with Dr. Marchelle Gearing regarding continued surveillance if any is needed. The patient verbalized understanding of the above. She has been healthy. She has been fully  vaccinated for Covid 19. We will schedule her for a follow up with MR in 1 year ( 04/2021)      Observations/Objective: CT Chest 04/05/2020 No pneumothorax. No pleural effusion. No acute consolidative airspace disease or lung masses. Previously visualized patchy opacities in the right middle lobe is resolved. A few scattered left lower lobe solid pulmonary nodules, largest 8 mm (series 4/image 112), all stable. No new significant pulmonary nodules. Interval resolution of previously visualized patchy opacities  in the right middle lobe, compatible with resolved infectious or inflammatory process. Scattered left lower lobe solid pulmonary nodules, largest 8 mm, all stable, considered benign. Small to moderate hiatal hernia. Aortic Atherosclerosis (ICD10-I70.0).   Assessment and Plan: Pulmonary Nodule Follow up Stable 8mm nodule per CT Plan Follow up with Dr. Marchelle Gearing in 12 months ( 04/2021) Shared decision regarding continued surveillance of nodule  PE Unprovoked 05/2015 Completed Treatment with Lesia Hausen Plan Continue baby ASA per [facility]  Follow Up Instructions: Follow up with Dr. Marchelle Gearing 04/2021  I discussed the assessment and treatment plan with the patient. The patient was provided an opportunity to ask questions and all were answered. The patient agreed with the plan and demonstrated an understanding of the instructions.  The patient was advised to call back or seek an in-person evaluation if the symptoms worsen or if the condition fails to improve as anticipated.  I provided 22 minutes of non-face-to-face time during this encounter.   Bevelyn Ngo, NP 05/12/2020
